# Patient Record
Sex: Female | Born: 1950 | Race: White | Hispanic: No | Smoking: Never smoker
Health system: Southern US, Community
[De-identification: ages and names within clinical notes are randomized; demographics above are authoritative.]

## PROBLEM LIST (undated history)

## (undated) DIAGNOSIS — F329 Major depressive disorder, single episode, unspecified: Secondary | ICD-10-CM

## (undated) DIAGNOSIS — F419 Anxiety disorder, unspecified: Secondary | ICD-10-CM

## (undated) DIAGNOSIS — F32A Depression, unspecified: Secondary | ICD-10-CM

## (undated) HISTORY — PX: CHOLECYSTECTOMY: SHX55

---

## 2014-03-30 ENCOUNTER — Ambulatory Visit (INDEPENDENT_AMBULATORY_CARE_PROVIDER_SITE_OTHER): Payer: BC Managed Care – PPO | Admitting: Emergency Medicine

## 2014-03-30 VITALS — BP 132/87 | HR 82 | Temp 98.4°F | Resp 18 | Wt 169.0 lb

## 2014-03-30 DIAGNOSIS — S61209A Unspecified open wound of unspecified finger without damage to nail, initial encounter: Secondary | ICD-10-CM

## 2014-03-30 DIAGNOSIS — S61011A Laceration without foreign body of right thumb without damage to nail, initial encounter: Secondary | ICD-10-CM

## 2014-03-30 NOTE — Progress Notes (Signed)
[facility] [address], [place] [id number] [phone]  Date:  03/30/2014   Name:  Margaret Perry   DOB:  08/18/1951   MRN:  [id number]  PCP:  Blair DolphinANNON, THOMAS B, MD    Chief Complaint: Laceration   History of Present Illness:  Margaret Perry is a 63 y.o. very pleasant female patient who presents with the following:  Patient was using a slicer at home and amputated the tip of her right thumb.  She is current on TD.  Not on anticoagulant.  No improvement with over the counter medications or other home remedies. Denies other complaint or health concern today.   There are no active problems to display for this patient.   No past medical history on file.  No past surgical history on file.  History  Substance Use Topics  . Smoking status: Not on file  . Smokeless tobacco: Not on file  . Alcohol Use: Not on file    No family history on file.  Allergies  Allergen Reactions  . Reglan [Metoclopramide]     Medication list has been reviewed and updated.  No current outpatient prescriptions on file prior to visit.   No current facility-administered medications on file prior to visit.    Review of Systems:  As per HPI, otherwise negative.    Physical Examination: Filed Vitals:   03/30/14 1703  BP: 132/87  Pulse: 82  Temp: 98.4 F (36.9 C)  Resp: 18   Filed Vitals:   03/30/14 1703  Weight: 169 lb (76.658 kg)   There is no height on file to calculate BMI. Ideal Body Weight:     GEN: WDWN, NAD, Non-toxic, Alert & Oriented x 3 HEENT: Atraumatic, Normocephalic.  Ears and Nose: No external deformity. EXTR: No clubbing/cyanosis/edema NEURO: Normal gait.  PSYCH: Normally interactive. Conversant. Not depressed or anxious appearing.  Calm demeanor.  RIght hand:  1 x 0.5 cm avulsion of tip of right thumb.  Spares the nail. NATI.  No FB  Assessment and Plan: Laceration finger "Band aid treatment" Follow up as needed  Signed,  Phillips OdorJeffery  Zidan Helget, MD

## 2014-03-30 NOTE — Patient Instructions (Signed)

## 2017-12-12 ENCOUNTER — Emergency Department (HOSPITAL_BASED_OUTPATIENT_CLINIC_OR_DEPARTMENT_OTHER)
Admission: EM | Admit: 2017-12-12 | Discharge: 2017-12-12 | Disposition: A | Discharge status: Home / Self Care | Payer: Medicare HMO | Attending: Emergency Medicine | Admitting: Emergency Medicine

## 2017-12-12 ENCOUNTER — Encounter (HOSPITAL_BASED_OUTPATIENT_CLINIC_OR_DEPARTMENT_OTHER): Payer: Self-pay | Admitting: *Deleted

## 2017-12-12 ENCOUNTER — Other Ambulatory Visit: Payer: Self-pay

## 2017-12-12 DIAGNOSIS — R42 Dizziness and giddiness: Secondary | ICD-10-CM | POA: Insufficient documentation

## 2017-12-12 DIAGNOSIS — E860 Dehydration: Secondary | ICD-10-CM | POA: Diagnosis not present

## 2017-12-12 DIAGNOSIS — R112 Nausea with vomiting, unspecified: Secondary | ICD-10-CM | POA: Diagnosis present

## 2017-12-12 DIAGNOSIS — Z79899 Other long term (current) drug therapy: Secondary | ICD-10-CM | POA: Insufficient documentation

## 2017-12-12 DIAGNOSIS — R197 Diarrhea, unspecified: Secondary | ICD-10-CM | POA: Insufficient documentation

## 2017-12-12 HISTORY — DX: Depression, unspecified: F32.A

## 2017-12-12 HISTORY — DX: Anxiety disorder, unspecified: F41.9

## 2017-12-12 HISTORY — DX: Major depressive disorder, single episode, unspecified: F32.9

## 2017-12-12 LAB — I-STAT CG4 LACTIC ACID, ED: Lactic Acid, Venous: 0.71 mmol/L (ref 0.5–1.9)

## 2017-12-12 LAB — URINALYSIS, ROUTINE W REFLEX MICROSCOPIC
BILIRUBIN URINE: NEGATIVE
GLUCOSE, UA: NEGATIVE mg/dL
KETONES UR: 15 mg/dL — AB
Leukocytes, UA: NEGATIVE
Nitrite: NEGATIVE
PH: 6 (ref 5.0–8.0)
Protein, ur: NEGATIVE mg/dL
Specific Gravity, Urine: 1.015 (ref 1.005–1.030)

## 2017-12-12 LAB — CBC WITH DIFFERENTIAL/PLATELET
BASOS ABS: 0 10*3/uL (ref 0.0–0.1)
Basophils Relative: 0 %
EOS PCT: 0 %
Eosinophils Absolute: 0 10*3/uL (ref 0.0–0.7)
HCT: 30.9 % — ABNORMAL LOW (ref 36.0–46.0)
Hemoglobin: 10.6 g/dL — ABNORMAL LOW (ref 12.0–15.0)
LYMPHS ABS: 1.3 10*3/uL (ref 0.7–4.0)
LYMPHS PCT: 10 %
MCH: 31.5 pg (ref 26.0–34.0)
MCHC: 34.3 g/dL (ref 30.0–36.0)
MCV: 92 fL (ref 78.0–100.0)
MONO ABS: 0.8 10*3/uL (ref 0.1–1.0)
MONOS PCT: 7 %
Neutro Abs: 10.2 10*3/uL — ABNORMAL HIGH (ref 1.7–7.7)
Neutrophils Relative %: 83 %
PLATELETS: 257 10*3/uL (ref 150–400)
RBC: 3.36 MIL/uL — ABNORMAL LOW (ref 3.87–5.11)
RDW: 13.4 % (ref 11.5–15.5)
WBC: 12.4 10*3/uL — ABNORMAL HIGH (ref 4.0–10.5)

## 2017-12-12 LAB — COMPREHENSIVE METABOLIC PANEL
ALT: 11 U/L — AB (ref 14–54)
AST: 15 U/L (ref 15–41)
Albumin: 3.5 g/dL (ref 3.5–5.0)
Alkaline Phosphatase: 64 U/L (ref 38–126)
Anion gap: 6 (ref 5–15)
BILIRUBIN TOTAL: 0.4 mg/dL (ref 0.3–1.2)
BUN: 43 mg/dL — AB (ref 6–20)
CHLORIDE: 108 mmol/L (ref 101–111)
CO2: 22 mmol/L (ref 22–32)
CREATININE: 0.97 mg/dL (ref 0.44–1.00)
Calcium: 8.4 mg/dL — ABNORMAL LOW (ref 8.9–10.3)
GFR calc Af Amer: >60 mL/min (ref >60)
GFR, EST NON AFRICAN AMERICAN: 59 mL/min — AB (ref >60)
Glucose, Bld: 128 mg/dL — ABNORMAL HIGH (ref 65–99)
Potassium: 4.5 mmol/L (ref 3.5–5.1)
Sodium: 136 mmol/L (ref 135–145)
Total Protein: 6.4 g/dL — ABNORMAL LOW (ref 6.5–8.1)

## 2017-12-12 LAB — LIPASE, BLOOD: LIPASE: 23 U/L (ref 11–51)

## 2017-12-12 LAB — URINALYSIS, MICROSCOPIC (REFLEX): WBC, UA: NONE SEEN WBC/hpf (ref 0–5)

## 2017-12-12 LAB — TROPONIN I: Troponin I: <0.03 ng/mL (ref <0.03)

## 2017-12-12 MED ORDER — PANTOPRAZOLE SODIUM 40 MG IV SOLR
40.0000 mg | Freq: Once | INTRAVENOUS | Status: AC
  Administered 2017-12-12: 40 mg via INTRAVENOUS
  Filled 2017-12-12: qty 40

## 2017-12-12 MED ORDER — SODIUM CHLORIDE 0.9 % IV BOLUS (SEPSIS)
1000.0000 mL | Freq: Once | INTRAVENOUS | Status: AC
  Administered 2017-12-12: 1000 mL via INTRAVENOUS

## 2017-12-12 MED ORDER — DIPHENOXYLATE-ATROPINE 2.5-0.025 MG PO TABS: 1.0000 | ORAL_TABLET | Freq: Four times a day (QID) | ORAL | 0 refills | Status: AC | PRN

## 2017-12-12 MED ORDER — ONDANSETRON 4 MG PO TBDP: 4.0000 mg | ORAL_TABLET | Freq: Three times a day (TID) | ORAL | 0 refills | Status: AC | PRN

## 2017-12-12 MED ORDER — DIPHENOXYLATE-ATROPINE 2.5-0.025 MG PO TABS
2.0000 | ORAL_TABLET | Freq: Once | ORAL | Status: AC
  Administered 2017-12-12: 2 via ORAL
  Filled 2017-12-12: qty 2

## 2017-12-12 MED ORDER — ONDANSETRON HCL 4 MG/2ML IJ SOLN
4.0000 mg | Freq: Once | INTRAMUSCULAR | Status: AC
  Administered 2017-12-12: 4 mg via INTRAVENOUS
  Filled 2017-12-12: qty 2

## 2017-12-12 NOTE — Discharge Instructions (Signed)
Push fluids to stay hydrated. You may still feel dizziness for the next 24 hours. Sit for a few seconds before you stand, stand for a few seconds before you walk. Follow-up with her primary care physician. Lomotil for diarrhea. Zofran as needed for nausea.

## 2017-12-12 NOTE — ED Triage Notes (Signed)
Abdominal pain yesterday. Vomiting last night and today. Weakness, fatigue. EMS transported her to the ED. They gave her 500cc NS and Zofran 4 mg. She is pale. Alert. No fever. States she thinks the last time she vomited she thinks it had blood in it.

## 2017-12-12 NOTE — ED Provider Notes (Addendum)
[facility] EMERGENCY DEPARTMENT Provider Note   CSN: [id number] Arrival date & time: 12/12/17  1431     History   Chief Complaint Chief Complaint  Patient presents with  . Emesis  . Abdominal Pain    HPI Margaret Perry is a 67 y.o. female. Chief complaint is dizziness vomiting and diarrhea  HPI 67 year old female. Started having nausea vomiting and diarrhea yesterday. Had emesis and symptoms all through the day. Diarrhea today. No blood pus or mucus in her stools. On her most recent episode of emesis she states her may been a few specks of blood but no frank hematemesis.  No abdominal pain. No chest pain.  No chest pain. No cardiac history. No recent fevers.  Past Medical History:  Diagnosis Date  . Anxiety   . Depression     There are no active problems to display for this patient.   Past Surgical History:  Procedure Laterality Date  . CHOLECYSTECTOMY      OB History    No data available       Home Medications    Prior to Admission medications   Medication Sig Start Date End Date Taking? Authorizing Provider  BuPROPion HCl (WELLBUTRIN PO) Take by mouth.   Yes [provider]  Escitalopram Oxalate (LEXAPRO PO) Take by mouth.   Yes [provider]  LORazepam (ATIVAN PO) Take by mouth.   Yes [provider]  diphenoxylate-atropine (LOMOTIL) 2.5-0.025 MG tablet Take 1 tablet by mouth 4 (four) times daily as needed for diarrhea or loose stools. 12/12/17   Rolland PorterJames, Layaan Mott, MD  ondansetron (ZOFRAN ODT) 4 MG disintegrating tablet Take 1 tablet (4 mg total) by mouth every 8 (eight) hours as needed for nausea. 12/12/17   Rolland PorterJames, Kailey Esquilin, MD    Family History No family history on file.  Social History Social History   Tobacco Use  . Smoking status: Never Smoker  . Smokeless tobacco: Never Used  Substance Use Topics  . Alcohol use: No    Frequency: Never  . Drug use: No     Allergies   Reglan [metoclopramide]   Review of  Systems Review of Systems  Constitutional: Negative for appetite change, chills, diaphoresis, fatigue and fever.  HENT: Negative for mouth sores, sore throat and trouble swallowing.   Eyes: Negative for visual disturbance.  Respiratory: Negative for cough, chest tightness, shortness of breath and wheezing.   Cardiovascular: Negative for chest pain.  Gastrointestinal: Positive for diarrhea, nausea and vomiting. Negative for abdominal distention and abdominal pain.  Endocrine: Negative for polydipsia, polyphagia and polyuria.  Genitourinary: Negative for dysuria, frequency and hematuria.  Musculoskeletal: Negative for gait problem.  Skin: Negative for color change, pallor and rash.  Neurological: Negative for dizziness, syncope, light-headedness and headaches.  Hematological: Does not bruise/bleed easily.  Psychiatric/Behavioral: Negative for behavioral problems and confusion.     Physical Exam Updated Vital Signs BP (!) 91/51   Pulse 72   Temp 98.1 F (36.7 C) (Oral)   Resp 12   Ht 5\' 4"  (1.626 m)   Wt 77.1 kg (170 lb)   SpO2 98%   BMI 29.18 kg/m   Physical Exam  Constitutional: She is oriented to person, place, and time. She appears well-developed and well-nourished. No distress.  Appears not feel well. Dry mucous membranes. Awake and alert.  HENT:  Head: Normocephalic.  Eyes: Conjunctivae are normal. Pupils are equal, round, and reactive to light. No scleral icterus.  Neck: Normal range of motion. Neck supple.  No thyromegaly present.  Cardiovascular: Normal rate and regular rhythm. Exam reveals no gallop and no friction rub.  No murmur heard. Pulmonary/Chest: Effort normal and breath sounds normal. No respiratory distress. She has no wheezes. She has no rales.  Abdominal: Soft. Bowel sounds are normal. She exhibits no distension. There is no tenderness. There is no rebound.  Abdomen soft. Nondistended. Nontender. Normal active bowel sounds. No pulsations. Her distal  perfusion is intact. 2+ DP and PT pulses. Capillary refill less than 2 seconds extremities.  Musculoskeletal: Normal range of motion.  Neurological: She is alert and oriented to person, place, and time.  Skin: Skin is warm and dry. No rash noted.  Psychiatric: She has a normal mood and affect. Her behavior is normal.     ED Treatments / Results  Labs (all labs ordered are listed, but only abnormal results are displayed) Labs Reviewed  COMPREHENSIVE METABOLIC PANEL - Abnormal; Notable for the following components:      Result Value   Glucose, Bld 128 (*)    BUN 43 (*)    Calcium 8.4 (*)    Total Protein 6.4 (*)    ALT 11 (*)    GFR calc non Af Amer 59 (*)    All other components within normal limits  CBC WITH DIFFERENTIAL/PLATELET - Abnormal; Notable for the following components:   WBC 12.4 (*)    RBC 3.36 (*)    Hemoglobin 10.6 (*)    HCT 30.9 (*)    Neutro Abs 10.2 (*)    All other components within normal limits  URINALYSIS, ROUTINE W REFLEX MICROSCOPIC - Abnormal; Notable for the following components:   Hgb urine dipstick SMALL (*)    Ketones, ur 15 (*)    All other components within normal limits  URINALYSIS, MICROSCOPIC (REFLEX) - Abnormal; Notable for the following components:   Bacteria, UA RARE (*)    Squamous Epithelial / LPF 0-5 (*)    All other components within normal limits  LIPASE, BLOOD  TROPONIN I  I-STAT CG4 LACTIC ACID, ED    EKG  EKG Interpretation  Date/Time:  Tuesday December 12 2017 15:54:34 EST Ventricular Rate:  80 PR Interval:    QRS Duration: 84 QT Interval:  411 QTC Calculation: 475 R Axis:   52 Text Interpretation:  Sinus rhythm Anteroseptal infarct, age indeterminate Confirmed by Rolland Porter ([id number]) on 12/12/2017 3:57:04 PM       Radiology No results found.  Procedures Procedures (including critical care time)  Medications Ordered in ED Medications  sodium chloride 0.9 % bolus 1,000 mL (0 mLs Intravenous Stopped 12/12/17 2032)    ondansetron (ZOFRAN) injection 4 mg (4 mg Intravenous Given 12/12/17 1605)  pantoprazole (PROTONIX) injection 40 mg (40 mg Intravenous Given 12/12/17 1606)  diphenoxylate-atropine (LOMOTIL) 2.5-0.025 MG per tablet 2 tablet (2 tablets Oral Given 12/12/17 1605)  sodium chloride 0.9 % bolus 1,000 mL (0 mLs Intravenous Stopped 12/12/17 1938)     Initial Impression / Assessment and Plan / ED Course  I have reviewed the triage vital signs and the nursing notes.  Pertinent labs & imaging results that were available during my care of the patient were reviewed by me and considered in my medical decision making (see chart for details).    Hypotensive. Given IV fluids. States her blood pressure does run low at times. Given Zofran by mouth for given 3 L of fluid. Subjectively feels considerably better. Is able to ambulate to the bathroom. Normal lactate. Normal EKG and  troponin. Recheck BP consistently in the 100-110 range.  Hemoglobin is 10. She does not know what her normal hemoglobin is. There are no labs in our system.  Discuss this with her at length. She is not describing frank hematemesis. She does not have abdominal pain. She does not have dark stools. If she develops any additional symptoms. If she has any worsening orthostasis or dizziness at home. If she has blood in her stools or dark stools or has emesis with blood in bastard recheck here immediately. She stress understanding of this. Discharge in stable condition.   Discharge home. Push fluids. Orthostasis precautions. Zofran, Lomotil when necessary. Primary care follow-up. ER with worsening  Final Clinical Impressions(s) / ED Diagnoses   Final diagnoses:  Nausea vomiting and diarrhea  Dehydration    ED Discharge Orders        Ordered    ondansetron (ZOFRAN ODT) 4 MG disintegrating tablet  Every 8 hours PRN     12/12/17 2028    diphenoxylate-atropine (LOMOTIL) 2.5-0.025 MG tablet  4 times daily PRN     12/12/17 2028       Rolland Porter, MD 12/12/17 2108    Rolland Porter, MD 12/12/17 2110

## 2017-12-16 ENCOUNTER — Encounter (HOSPITAL_COMMUNITY): Payer: Self-pay | Admitting: *Deleted

## 2017-12-16 ENCOUNTER — Inpatient Hospital Stay (HOSPITAL_COMMUNITY)
Admission: EM | Admit: 2017-12-16 | Discharge: 2017-12-19 | DRG: 378 | Disposition: A | Discharge status: Home / Self Care | Payer: Medicare HMO | Attending: Internal Medicine | Admitting: Internal Medicine

## 2017-12-16 ENCOUNTER — Emergency Department (HOSPITAL_COMMUNITY): Payer: Medicare HMO

## 2017-12-16 ENCOUNTER — Other Ambulatory Visit: Payer: Self-pay

## 2017-12-16 DIAGNOSIS — F419 Anxiety disorder, unspecified: Secondary | ICD-10-CM | POA: Diagnosis present

## 2017-12-16 DIAGNOSIS — K5730 Diverticulosis of large intestine without perforation or abscess without bleeding: Secondary | ICD-10-CM | POA: Diagnosis present

## 2017-12-16 DIAGNOSIS — F329 Major depressive disorder, single episode, unspecified: Secondary | ICD-10-CM | POA: Diagnosis present

## 2017-12-16 DIAGNOSIS — K921 Melena: Principal | ICD-10-CM | POA: Diagnosis present

## 2017-12-16 DIAGNOSIS — K269 Duodenal ulcer, unspecified as acute or chronic, without hemorrhage or perforation: Secondary | ICD-10-CM | POA: Diagnosis present

## 2017-12-16 DIAGNOSIS — D62 Acute posthemorrhagic anemia: Secondary | ICD-10-CM | POA: Diagnosis present

## 2017-12-16 DIAGNOSIS — F32A Depression, unspecified: Secondary | ICD-10-CM | POA: Diagnosis present

## 2017-12-16 DIAGNOSIS — K2960 Other gastritis without bleeding: Secondary | ICD-10-CM | POA: Diagnosis present

## 2017-12-16 DIAGNOSIS — E876 Hypokalemia: Secondary | ICD-10-CM | POA: Diagnosis present

## 2017-12-16 DIAGNOSIS — G4700 Insomnia, unspecified: Secondary | ICD-10-CM | POA: Diagnosis present

## 2017-12-16 DIAGNOSIS — M5136 Other intervertebral disc degeneration, lumbar region: Secondary | ICD-10-CM | POA: Diagnosis present

## 2017-12-16 DIAGNOSIS — Z888 Allergy status to other drugs, medicaments and biological substances status: Secondary | ICD-10-CM

## 2017-12-16 DIAGNOSIS — D649 Anemia, unspecified: Secondary | ICD-10-CM | POA: Diagnosis present

## 2017-12-16 DIAGNOSIS — G43909 Migraine, unspecified, not intractable, without status migrainosus: Secondary | ICD-10-CM | POA: Diagnosis present

## 2017-12-16 DIAGNOSIS — R402414 Glasgow coma scale score 13-15, 24 hours or more after hospital admission: Secondary | ICD-10-CM | POA: Diagnosis not present

## 2017-12-16 DIAGNOSIS — K2980 Duodenitis without bleeding: Secondary | ICD-10-CM | POA: Diagnosis present

## 2017-12-16 DIAGNOSIS — K922 Gastrointestinal hemorrhage, unspecified: Secondary | ICD-10-CM | POA: Diagnosis present

## 2017-12-16 DIAGNOSIS — K429 Umbilical hernia without obstruction or gangrene: Secondary | ICD-10-CM | POA: Diagnosis present

## 2017-12-16 DIAGNOSIS — Z79899 Other long term (current) drug therapy: Secondary | ICD-10-CM

## 2017-12-16 LAB — CBC
HEMATOCRIT: 22 % — AB (ref 36.0–46.0)
Hemoglobin: 7.4 g/dL — ABNORMAL LOW (ref 12.0–15.0)
MCH: 31 pg (ref 26.0–34.0)
MCHC: 33.6 g/dL (ref 30.0–36.0)
MCV: 92.1 fL (ref 78.0–100.0)
Platelets: 290 10*3/uL (ref 150–400)
RBC: 2.39 MIL/uL — AB (ref 3.87–5.11)
RDW: 14.5 % (ref 11.5–15.5)
WBC: 7.4 10*3/uL (ref 4.0–10.5)

## 2017-12-16 LAB — COMPREHENSIVE METABOLIC PANEL
ALT: 16 U/L (ref 14–54)
ANION GAP: 11 (ref 5–15)
AST: 23 U/L (ref 15–41)
Albumin: 3.4 g/dL — ABNORMAL LOW (ref 3.5–5.0)
Alkaline Phosphatase: 60 U/L (ref 38–126)
BUN: 9 mg/dL (ref 6–20)
CHLORIDE: 107 mmol/L (ref 101–111)
CO2: 21 mmol/L — AB (ref 22–32)
Calcium: 8.6 mg/dL — ABNORMAL LOW (ref 8.9–10.3)
Creatinine, Ser: 0.92 mg/dL (ref 0.44–1.00)
GFR calc non Af Amer: >60 mL/min (ref >60)
Glucose, Bld: 140 mg/dL — ABNORMAL HIGH (ref 65–99)
POTASSIUM: 3.3 mmol/L — AB (ref 3.5–5.1)
SODIUM: 139 mmol/L (ref 135–145)
Total Bilirubin: 0.4 mg/dL (ref 0.3–1.2)
Total Protein: 5.9 g/dL — ABNORMAL LOW (ref 6.5–8.1)

## 2017-12-16 LAB — URINALYSIS, ROUTINE W REFLEX MICROSCOPIC
BILIRUBIN URINE: NEGATIVE
Bacteria, UA: NONE SEEN
Glucose, UA: NEGATIVE mg/dL
Hgb urine dipstick: NEGATIVE
KETONES UR: 5 mg/dL — AB
Nitrite: NEGATIVE
PROTEIN: NEGATIVE mg/dL
RBC / HPF: NONE SEEN RBC/hpf (ref 0–5)
Specific Gravity, Urine: 1.012 (ref 1.005–1.030)
pH: 5 (ref 5.0–8.0)

## 2017-12-16 LAB — DIFFERENTIAL
BASOS ABS: 0 10*3/uL (ref 0.0–0.1)
Basophils Relative: 0 %
Eosinophils Absolute: 0.1 10*3/uL (ref 0.0–0.7)
Eosinophils Relative: 1 %
Lymphocytes Relative: 24 %
Lymphs Abs: 1.7 10*3/uL (ref 0.7–4.0)
MONO ABS: 0.5 10*3/uL (ref 0.1–1.0)
Monocytes Relative: 8 %
NEUTROS ABS: 4.8 10*3/uL (ref 1.7–7.7)
Neutrophils Relative %: 67 %

## 2017-12-16 LAB — POC OCCULT BLOOD, ED: FECAL OCCULT BLD: POSITIVE — AB

## 2017-12-16 LAB — PROTIME-INR
INR: 1.07
Prothrombin Time: 13.8 seconds (ref 11.4–15.2)

## 2017-12-16 LAB — LIPASE, BLOOD: LIPASE: 26 U/L (ref 11–51)

## 2017-12-16 MED ORDER — PANTOPRAZOLE SODIUM 40 MG IV SOLR
40.0000 mg | INTRAVENOUS | Status: DC
  Filled 2017-12-16: qty 40

## 2017-12-16 MED ORDER — IOPAMIDOL (ISOVUE-300) INJECTION 61%
INTRAVENOUS | Status: AC
  Administered 2017-12-16: 100 mL
  Filled 2017-12-16: qty 100

## 2017-12-16 MED ORDER — SODIUM CHLORIDE 0.9 % IV SOLN
80.0000 mg | Freq: Once | INTRAVENOUS | Status: AC
  Administered 2017-12-17: 80 mg via INTRAVENOUS
  Filled 2017-12-16: qty 80

## 2017-12-16 MED ORDER — SODIUM CHLORIDE 0.9 % IV BOLUS (SEPSIS)
1000.0000 mL | Freq: Once | INTRAVENOUS | Status: AC
  Administered 2017-12-16: 1000 mL via INTRAVENOUS

## 2017-12-16 MED ORDER — PANTOPRAZOLE SODIUM 40 MG IV SOLR
8.0000 mg/h | INTRAVENOUS | Status: DC
  Administered 2017-12-17 – 2017-12-18 (×2): 8 mg/h via INTRAVENOUS
  Filled 2017-12-16 (×5): qty 80

## 2017-12-16 NOTE — ED Notes (Signed)
ED Provider at bedside. 

## 2017-12-16 NOTE — ED Notes (Signed)
Pt reports 12-11-17 had vomited x 1, diarrhea x 2. 12-12-17 pt felt too weak to stand, was seen in ED.   Hgb 10.  Has chronic RUQ abd "burning" that is worsening.  Seen GI doctor yesterday declined endoscopy.  Last BM 12-11-17 that was black and tarry.  Shortness of breath with moving.

## 2017-12-16 NOTE — ED Triage Notes (Signed)
Pt has been having NVD for the past week. [facility] for hypotensive and similar symptoms then discharge home. Pt pale , appears generally weak, has not vomited today. C/o RUQ pain as well

## 2017-12-16 NOTE — ED Provider Notes (Signed)
[facility] EMERGENCY DEPARTMENT Provider Note   CSN: [id number] Arrival date & time: 12/16/17  1941     History   Chief Complaint Chief Complaint  Patient presents with  . Abdominal Pain    HPI Margaret Perry is a 67 y.o. female.  67 year old female with a history of anxiety and depression presents to the emergency department for generalized weakness.  She states, "I think I am losing blood from somewhere".  She states that symptoms began 5 days ago, on Monday.  She had one episode of emesis as well as one episode of diarrhea.  This was followed by profound weakness the following day.  She was seen at [facility] where she was hypotensive with a blood pressure of 89/46.  This improved with IV fluids, making the patient feels slightly better.  She was discharged and reports continued fatigue.  She has worsening shortness of breath with any type of exertion.  She has not had any nausea or vomiting.  She denies any bowel movement in the past 5 days since her episode of diarrhea.  This bowel movement was not specifically tarry and was not c/w hematochezia.  She did see her gastroenterologist yesterday who advised endoscopy.  She did not have this completed because she did not have someone to drive her home following sedation.  She denies frequent NSAID use.  She does report some associated right upper quadrant pain.  This is nonradiating and "burning" in nature.  Abdominal surgical history significant for cholecystectomy.  Patient denies fevers, urinary symptoms, chest pain.  She did have a mild headache yesterday which has resolved.  She is unsure of the timing of her last colonoscopy.  GI - [facility]      Past Medical History:  Diagnosis Date  . Anxiety   . Depression     There are no active problems to display for this patient.   Past Surgical History:  Procedure Laterality Date  . CHOLECYSTECTOMY      OB History      No data available       Home Medications    Prior to Admission medications   Medication Sig Start Date End Date Taking? Authorizing Provider  ALPRAZolam Prudy Feeler) 0.5 MG tablet Take 0.5 mg by mouth 3 (three) times daily as needed for anxiety.   Yes [provider]  aspirin-acetaminophen-caffeine (EXCEDRIN MIGRAINE) [phone] MG tablet Take 1-2 tablets by mouth every 6 (six) hours as needed for headache.   Yes [provider]  buPROPion (WELLBUTRIN XL) 300 MG 24 hr tablet Take 300 mg by mouth daily.    Yes [provider]  diphenoxylate-atropine (LOMOTIL) 2.5-0.025 MG tablet Take 1 tablet by mouth 4 (four) times daily as needed for diarrhea or loose stools. 12/12/17  Yes Rolland Porter, MD  escitalopram (LEXAPRO) 20 MG tablet Take 20 mg by mouth daily.   Yes [provider]  ondansetron (ZOFRAN ODT) 4 MG disintegrating tablet Take 1 tablet (4 mg total) by mouth every 8 (eight) hours as needed for nausea. 12/12/17  Yes Rolland Porter, MD  zolpidem (AMBIEN) 5 MG tablet Take 20 mg by mouth at bedtime.    Yes [provider]    Family History No family history on file.  Social History Social History   Tobacco Use  . Smoking status: Never Smoker  . Smokeless tobacco: Never Used  Substance Use Topics  . Alcohol use: No    Frequency: Never  .  Drug use: No     Allergies   Reglan [metoclopramide]   Review of Systems Review of Systems Ten systems reviewed and are negative for acute change, except as noted in the HPI.    Physical Exam Updated Vital Signs BP (!) 126/56   Pulse 94   Temp 98.7 F (37.1 C) (Oral)   Resp 16   SpO2 100%   Physical Exam  Constitutional: She is oriented to person, place, and time. She appears well-developed and well-nourished. No distress.  Nontoxic appearing and in NAD  HENT:  Head: Normocephalic and atraumatic.  Eyes: Conjunctivae and EOM are normal. No scleral icterus.  Neck: Normal range of motion.   Cardiovascular: Normal rate, regular rhythm and intact distal pulses.  Pulmonary/Chest: Effort normal. No stridor. No respiratory distress.  Respirations even and unlabored  Abdominal: Soft. Normal appearance. There is tenderness in the right upper quadrant.  Soft abdomen with RUQ TTP. No masses or peritoneal signs.  Genitourinary:  Genitourinary Comments: Scant amount of tarry stool on DRE. No significant stool in rectal vault.  Musculoskeletal: Normal range of motion.  Neurological: She is alert and oriented to person, place, and time. She exhibits normal muscle tone. Coordination normal.  Skin: Skin is warm and dry. No rash noted. She is not diaphoretic. No erythema. No pallor.  Psychiatric: She has a normal mood and affect. Her behavior is normal.  Nursing note and vitals reviewed.    ED Treatments / Results  Labs (all labs ordered are listed, but only abnormal results are displayed) Labs Reviewed  COMPREHENSIVE METABOLIC PANEL - Abnormal; Notable for the following components:      Result Value   Potassium 3.3 (*)    CO2 21 (*)    Glucose, Bld 140 (*)    Calcium 8.6 (*)    Total Protein 5.9 (*)    Albumin 3.4 (*)    All other components within normal limits  CBC - Abnormal; Notable for the following components:   RBC 2.39 (*)    Hemoglobin 7.4 (*)    HCT 22.0 (*)    All other components within normal limits  URINALYSIS, ROUTINE W REFLEX MICROSCOPIC - Abnormal; Notable for the following components:   Ketones, ur 5 (*)    Leukocytes, UA TRACE (*)    Squamous Epithelial / LPF 0-5 (*)    All other components within normal limits  POC OCCULT BLOOD, ED - Abnormal; Notable for the following components:   Fecal Occult Bld POSITIVE (*)    All other components within normal limits  LIPASE, BLOOD  PROTIME-INR  DIFFERENTIAL  TYPE AND SCREEN  ABO/RH    EKG  EKG Interpretation None       Radiology Ct Abdomen Pelvis W Contrast  Result Date: 12/16/2017 CLINICAL DATA:   67 year old female with abdominal pain. EXAM: CT ABDOMEN AND PELVIS WITH CONTRAST TECHNIQUE: Multidetector CT imaging of the abdomen and pelvis was performed using the standard protocol following bolus administration of intravenous contrast. CONTRAST:  ISOVUE-300 IOPAMIDOL (ISOVUE-300) INJECTION 61% COMPARISON:  Lumbar spine radiograph dated 07/18/2017 FINDINGS: Lower chest: Minimal right lung base atelectasis/scarring. No intra-free air or free fluid. Hepatobiliary: Cholecystectomy. The liver is unremarkable. No intrahepatic biliary ductal dilatation. Pancreas: Unremarkable. No pancreatic ductal dilatation or surrounding inflammatory changes. Spleen: Normal in size without focal abnormality. Adrenals/Urinary Tract: Adrenal glands are unremarkable. Kidneys are normal, without renal calculi, focal lesion, or hydronephrosis. Bladder is unremarkable. Stomach/Bowel: There is inflammatory changes of the second portion of the duodenum  most consistent with duodenitis. Underlying peptic ulcer is not entirely excluded. Clinical correlation is recommended there are small scattered distal colonic diverticula without active inflammatory changes. No bowel obstruction. Normal appendix. Vascular/Lymphatic: Mild aortoiliac atherosclerotic disease. The origins of the celiac axis, SMA, IMA are patent. No portal venous gas. There is no adenopathy. Reproductive: The uterus and ovaries are grossly unremarkable. Other: Small fat containing umbilical hernia Musculoskeletal: Degenerative changes at L4-L5. No acute osseous pathology. IMPRESSION: 1. Inflammatory changes of the second portion of the duodenum may represent duodenitis. Peptic ulcer is not excluded. There is no fluid collection or perforation. 2. Sigmoid diverticulosis.  No bowel obstruction.  Normal appendix. 3. Mild Aortic Atherosclerosis (ICD10-I70.0). Electronically Signed   By: Elgie CollardArash  Radparvar M.D.   On: 12/16/2017 23:16    Procedures Procedures (including  critical care time)  Medications Ordered in ED Medications  pantoprazole (PROTONIX) 80 mg in sodium chloride 0.9 % 100 mL IVPB (not administered)  pantoprazole (PROTONIX) 80 mg in sodium chloride 0.9 % 250 mL (0.32 mg/mL) infusion (not administered)  sodium chloride 0.9 % bolus 1,000 mL (0 mLs Intravenous Stopped 12/16/17 2302)  iopamidol (ISOVUE-300) 61 % injection (100 mLs  Contrast Given 12/16/17 2248)    CRITICAL CARE Performed by: Antony MaduraKelly Kshawn Canal   Total critical care time: 35 minutes  Critical care time was exclusive of separately billable procedures and treating other patients.  Critical care was necessary to treat or prevent imminent or life-threatening deterioration.  Critical care was time spent personally by me on the following activities: development of treatment plan with patient and/or surrogate as well as nursing, discussions with consultants, evaluation of patient's response to treatment, examination of patient, obtaining history from patient or surrogate, ordering and performing treatments and interventions, ordering and review of laboratory studies, ordering and review of radiographic studies, pulse oximetry and re-evaluation of patient's condition.   Initial Impression / Assessment and Plan / ED Course  I have reviewed the triage vital signs and the nursing notes.  Pertinent labs & imaging results that were available during my care of the patient were reviewed by me and considered in my medical decision making (see chart for details).      67 year old female presents to the emergency department for generalized weakness and dyspnea on exertion.  She was found to have a 3 g hemoglobin drop from her prior visit 4 days ago.  A CT was obtained given history of abdominal pain to rule out any obstruction or mass; patient had reported no bowel movement in the past 5 days.  CT shows findings consistent with duodenitis which is likely the source of her bleeding.  IV Protonix  ordered and plan for admission for EGD.  She has been hydrated with IV fluids.  No transfusion initiated in the ED given hemodynamic stability since arrival.  Case discussed with Dr. Antionette Charpyd who will admit.  Patient [facility] by [facility], Dr. Katrinka BlazingSmith.   Final Clinical Impressions(s) / ED Diagnoses   Final diagnoses:  UGI bleed  Symptomatic anemia    ED Discharge Orders    None       Antony MaduraHumes, Camilah Spillman, PA-C 12/16/17 2358    Donnetta Hutchingook, Brian, MD 12/20/17 1007

## 2017-12-17 ENCOUNTER — Encounter (HOSPITAL_COMMUNITY)
Admission: EM | Disposition: A | Discharge status: Home / Self Care | Payer: Self-pay | Source: Home / Self Care | Attending: Internal Medicine

## 2017-12-17 ENCOUNTER — Encounter (HOSPITAL_COMMUNITY): Payer: Self-pay | Admitting: *Deleted

## 2017-12-17 ENCOUNTER — Other Ambulatory Visit: Payer: Self-pay

## 2017-12-17 DIAGNOSIS — G43909 Migraine, unspecified, not intractable, without status migrainosus: Secondary | ICD-10-CM | POA: Diagnosis present

## 2017-12-17 DIAGNOSIS — K5730 Diverticulosis of large intestine without perforation or abscess without bleeding: Secondary | ICD-10-CM | POA: Diagnosis present

## 2017-12-17 DIAGNOSIS — K269 Duodenal ulcer, unspecified as acute or chronic, without hemorrhage or perforation: Secondary | ICD-10-CM | POA: Diagnosis present

## 2017-12-17 DIAGNOSIS — K922 Gastrointestinal hemorrhage, unspecified: Secondary | ICD-10-CM | POA: Diagnosis present

## 2017-12-17 DIAGNOSIS — G4700 Insomnia, unspecified: Secondary | ICD-10-CM | POA: Diagnosis present

## 2017-12-17 DIAGNOSIS — F419 Anxiety disorder, unspecified: Secondary | ICD-10-CM | POA: Diagnosis present

## 2017-12-17 DIAGNOSIS — K2980 Duodenitis without bleeding: Secondary | ICD-10-CM | POA: Diagnosis present

## 2017-12-17 DIAGNOSIS — K429 Umbilical hernia without obstruction or gangrene: Secondary | ICD-10-CM | POA: Diagnosis present

## 2017-12-17 DIAGNOSIS — E876 Hypokalemia: Secondary | ICD-10-CM | POA: Diagnosis present

## 2017-12-17 DIAGNOSIS — D62 Acute posthemorrhagic anemia: Secondary | ICD-10-CM | POA: Diagnosis present

## 2017-12-17 DIAGNOSIS — D649 Anemia, unspecified: Secondary | ICD-10-CM

## 2017-12-17 DIAGNOSIS — M5136 Other intervertebral disc degeneration, lumbar region: Secondary | ICD-10-CM | POA: Diagnosis present

## 2017-12-17 DIAGNOSIS — Z888 Allergy status to other drugs, medicaments and biological substances status: Secondary | ICD-10-CM | POA: Diagnosis not present

## 2017-12-17 DIAGNOSIS — F329 Major depressive disorder, single episode, unspecified: Secondary | ICD-10-CM | POA: Diagnosis present

## 2017-12-17 DIAGNOSIS — K2960 Other gastritis without bleeding: Secondary | ICD-10-CM | POA: Diagnosis present

## 2017-12-17 DIAGNOSIS — Z79899 Other long term (current) drug therapy: Secondary | ICD-10-CM | POA: Diagnosis not present

## 2017-12-17 DIAGNOSIS — R402414 Glasgow coma scale score 13-15, 24 hours or more after hospital admission: Secondary | ICD-10-CM | POA: Diagnosis not present

## 2017-12-17 DIAGNOSIS — K921 Melena: Secondary | ICD-10-CM | POA: Diagnosis present

## 2017-12-17 HISTORY — PX: ESOPHAGOGASTRODUODENOSCOPY: SHX5428

## 2017-12-17 LAB — CBC
HCT: 22 % — ABNORMAL LOW (ref 36.0–46.0)
HEMOGLOBIN: 7.4 g/dL — AB (ref 12.0–15.0)
MCH: 31.5 pg (ref 26.0–34.0)
MCHC: 33.6 g/dL (ref 30.0–36.0)
MCV: 93.6 fL (ref 78.0–100.0)
PLATELETS: 200 10*3/uL (ref 150–400)
RBC: 2.35 MIL/uL — AB (ref 3.87–5.11)
RDW: 15 % (ref 11.5–15.5)
WBC: 6.6 10*3/uL (ref 4.0–10.5)

## 2017-12-17 LAB — HEMOGLOBIN AND HEMATOCRIT, BLOOD
HCT: 30.2 % — ABNORMAL LOW (ref 36.0–46.0)
HEMOGLOBIN: 9.9 g/dL — AB (ref 12.0–15.0)

## 2017-12-17 LAB — BASIC METABOLIC PANEL
ANION GAP: 9 (ref 5–15)
BUN: 8 mg/dL (ref 6–20)
CHLORIDE: 111 mmol/L (ref 101–111)
CO2: 21 mmol/L — AB (ref 22–32)
Calcium: 7.8 mg/dL — ABNORMAL LOW (ref 8.9–10.3)
Creatinine, Ser: 0.8 mg/dL (ref 0.44–1.00)
GFR calc Af Amer: >60 mL/min (ref >60)
GFR calc non Af Amer: >60 mL/min (ref >60)
GLUCOSE: 108 mg/dL — AB (ref 65–99)
POTASSIUM: 3.4 mmol/L — AB (ref 3.5–5.1)
Sodium: 141 mmol/L (ref 135–145)

## 2017-12-17 LAB — PREPARE RBC (CROSSMATCH)

## 2017-12-17 LAB — ABO/RH: ABO/RH(D): O POS

## 2017-12-17 SURGERY — EGD (ESOPHAGOGASTRODUODENOSCOPY)
Anesthesia: Moderate Sedation | Laterality: Left

## 2017-12-17 MED ORDER — SODIUM CHLORIDE 0.9% FLUSH
3.0000 mL | Freq: Two times a day (BID) | INTRAVENOUS | Status: DC
  Administered 2017-12-17 – 2017-12-19 (×4): 3 mL via INTRAVENOUS

## 2017-12-17 MED ORDER — FENTANYL CITRATE (PF) 100 MCG/2ML IJ SOLN
INTRAMUSCULAR | Status: AC
  Filled 2017-12-17: qty 4

## 2017-12-17 MED ORDER — DIPHENHYDRAMINE HCL 50 MG/ML IJ SOLN
INTRAMUSCULAR | Status: AC
  Filled 2017-12-17: qty 1

## 2017-12-17 MED ORDER — ACETAMINOPHEN 325 MG PO TABS
650.0000 mg | ORAL_TABLET | Freq: Four times a day (QID) | ORAL | Status: DC | PRN
  Administered 2017-12-18 – 2017-12-19 (×2): 650 mg via ORAL
  Filled 2017-12-17 (×2): qty 2

## 2017-12-17 MED ORDER — POTASSIUM CHLORIDE IN NACL 20-0.9 MEQ/L-% IV SOLN
INTRAVENOUS | Status: AC
  Administered 2017-12-17: 01:00:00 via INTRAVENOUS
  Filled 2017-12-17 (×2): qty 1000

## 2017-12-17 MED ORDER — MIDAZOLAM HCL 10 MG/2ML IJ SOLN
INTRAMUSCULAR | Status: DC | PRN
  Administered 2017-12-17 (×2): 2 mg via INTRAVENOUS
  Administered 2017-12-17: 1 mg via INTRAVENOUS

## 2017-12-17 MED ORDER — SPOT INK MARKER SYRINGE KIT
PACK | SUBMUCOSAL | Status: AC
  Filled 2017-12-17: qty 5

## 2017-12-17 MED ORDER — SODIUM CHLORIDE 0.9 % IV SOLN: 250.0000 mL | INTRAVENOUS | Status: DC | PRN

## 2017-12-17 MED ORDER — ONDANSETRON HCL 4 MG/2ML IJ SOLN: 4.0000 mg | Freq: Four times a day (QID) | INTRAMUSCULAR | Status: DC | PRN

## 2017-12-17 MED ORDER — ALPRAZOLAM 0.5 MG PO TABS
0.5000 mg | ORAL_TABLET | Freq: Three times a day (TID) | ORAL | Status: DC | PRN
  Administered 2017-12-17 – 2017-12-18 (×3): 0.5 mg via ORAL
  Filled 2017-12-17 (×3): qty 1

## 2017-12-17 MED ORDER — DIPHENHYDRAMINE HCL 50 MG/ML IJ SOLN
INTRAMUSCULAR | Status: DC | PRN
  Administered 2017-12-17 (×2): 25 mg via INTRAVENOUS

## 2017-12-17 MED ORDER — ACETAMINOPHEN 650 MG RE SUPP: 650.0000 mg | Freq: Four times a day (QID) | RECTAL | Status: DC | PRN

## 2017-12-17 MED ORDER — FENTANYL CITRATE (PF) 100 MCG/2ML IJ SOLN
INTRAMUSCULAR | Status: DC | PRN
  Administered 2017-12-17 (×3): 25 ug via INTRAVENOUS

## 2017-12-17 MED ORDER — EPINEPHRINE PF 1 MG/10ML IJ SOSY
PREFILLED_SYRINGE | INTRAMUSCULAR | Status: AC
  Filled 2017-12-17: qty 10

## 2017-12-17 MED ORDER — ESCITALOPRAM OXALATE 20 MG PO TABS
20.0000 mg | ORAL_TABLET | Freq: Every day | ORAL | Status: DC
  Administered 2017-12-18 – 2017-12-19 (×2): 20 mg via ORAL
  Filled 2017-12-17 (×2): qty 1

## 2017-12-17 MED ORDER — ONDANSETRON HCL 4 MG PO TABS: 4.0000 mg | ORAL_TABLET | Freq: Four times a day (QID) | ORAL | Status: DC | PRN

## 2017-12-17 MED ORDER — SODIUM CHLORIDE 0.9 % IV SOLN
Freq: Once | INTRAVENOUS | Status: AC
  Administered 2017-12-17: 1000 mL via INTRAVENOUS

## 2017-12-17 MED ORDER — ZOLPIDEM TARTRATE 5 MG PO TABS
5.0000 mg | ORAL_TABLET | Freq: Every day | ORAL | Status: DC
  Administered 2017-12-17: 5 mg via ORAL
  Filled 2017-12-17: qty 1

## 2017-12-17 MED ORDER — POTASSIUM CHLORIDE 10 MEQ/100ML IV SOLN
10.0000 meq | INTRAVENOUS | Status: AC
  Administered 2017-12-17 (×2): 10 meq via INTRAVENOUS
  Filled 2017-12-17: qty 100

## 2017-12-17 MED ORDER — LORAZEPAM 2 MG/ML IJ SOLN
0.5000 mg | Freq: Once | INTRAMUSCULAR | Status: AC
  Administered 2017-12-17: 0.5 mg via INTRAVENOUS
  Filled 2017-12-17: qty 1

## 2017-12-17 MED ORDER — BUPROPION HCL ER (XL) 150 MG PO TB24
300.0000 mg | ORAL_TABLET | Freq: Every day | ORAL | Status: DC
  Administered 2017-12-18 – 2017-12-19 (×2): 300 mg via ORAL
  Filled 2017-12-17 (×2): qty 2

## 2017-12-17 MED ORDER — SODIUM CHLORIDE 0.9 % IV SOLN: INTRAVENOUS | Status: DC

## 2017-12-17 MED ORDER — DIPHENHYDRAMINE HCL 25 MG PO CAPS
25.0000 mg | ORAL_CAPSULE | Freq: Once | ORAL | Status: AC
  Administered 2017-12-17: 25 mg via ORAL
  Filled 2017-12-17: qty 1

## 2017-12-17 MED ORDER — SODIUM CHLORIDE 0.9% FLUSH: 3.0000 mL | INTRAVENOUS | Status: DC | PRN

## 2017-12-17 MED ORDER — FENTANYL CITRATE (PF) 100 MCG/2ML IJ SOLN: 25.0000 ug | INTRAMUSCULAR | Status: DC | PRN

## 2017-12-17 MED ORDER — SODIUM CHLORIDE 0.9 % IV SOLN
Freq: Once | INTRAVENOUS | Status: AC
  Administered 2017-12-17: 01:00:00 via INTRAVENOUS

## 2017-12-17 MED ORDER — ZOLPIDEM TARTRATE 5 MG PO TABS
5.0000 mg | ORAL_TABLET | Freq: Every evening | ORAL | Status: DC | PRN
  Administered 2017-12-17 – 2017-12-18 (×2): 5 mg via ORAL
  Filled 2017-12-17 (×2): qty 1

## 2017-12-17 MED ORDER — POTASSIUM CHLORIDE 10 MEQ/100ML IV SOLN
10.0000 meq | INTRAVENOUS | Status: AC
  Administered 2017-12-17: 10 meq via INTRAVENOUS
  Filled 2017-12-17: qty 100

## 2017-12-17 MED ORDER — MIDAZOLAM HCL 5 MG/ML IJ SOLN
INTRAMUSCULAR | Status: AC
  Filled 2017-12-17: qty 3

## 2017-12-17 NOTE — Progress Notes (Signed)
67 YO Female admitted with black tarry stools upper GI bleed.  Started on protonix drip and got one of blood transfusion.  Patient was admitted with a hemoglobin of 7.4 which was down from 10.64 days ago.  Posttransfusion hemoglobin is still 7.4 patient feels weak and lethargic and short of breath.  I placed a call to Dr. Loreta AveMann who has kindly agreed to see the patient today.  Continue n.p.o.

## 2017-12-17 NOTE — ED Notes (Signed)
C/o feeling light headed. C/o pain in elbow

## 2017-12-17 NOTE — ED Notes (Signed)
Patient denies pain and is resting comfortably.  

## 2017-12-17 NOTE — Progress Notes (Signed)
Reviewed MAR with pt and requested Ambien 10mg , Benadryl or Melatonin to sleep per c/o getting Ambien 5mg  last HS and not sleeping.

## 2017-12-17 NOTE — H&P (Signed)
History and Physical    Margaret Perry WJX:[id number] DOB: 1951/07/19 DOA: 12/16/2017  PCP: Herma Carson, MD   Patient coming from: Home  Chief Complaint: DOE, lethargy, pallor, RUQ pain   HPI: Margaret Perry is a 67 y.o. female with medical history significant for migraines, anxiety, depression, and insomnia, now presenting to the emergency department with right upper quadrant abdominal pain, exertional dyspnea, lethargy, and pallor.  Patient reports that she had been in her usual state of health until approximately 5 days ago when she noted a burning discomfort in the right upper quadrant with mild nausea and one episode of vomiting which may have contained some specks of red blood.  She then had a bowel movement described as black and tarry.  Since that time, she has noted progressive exertional dyspnea and lethargy.  She was seen in the emergency department 1 day after the black tarry stool, was fluid resuscitated, and discharged home.  She saw her gastroenterologist, Dr. Katrinka Blazing of [facility] in the clinic yesterday, upper endoscopy was recommended, but the patient declined at that time.  She now presents with progressive worsening in her dyspnea and lethargy, as well as persistent burning discomfort in the right upper quadrant.  Denies any bowel movement in the past few days and is no longer vomiting.  Denies any alcohol use but reports using Excedrin Migraine with aspirin about once a week.  ED Course: Upon arrival to the ED, patient is found to be afebrile, saturating well on room air, and with vitals otherwise normal.  CBC is notable for a hemoglobin of 7.4, down from 10.6 four days earlier, and down from 13.1 in June.  Fecal occult blood testing is positive.  INR is normal and chemistry panel is notable for mild hypokalemia.  CT of the abdomen and pelvis demonstrates inflammatory changes at the second portion of the duodenum, possibly reflecting peptic ulcer disease, but without fluid collection or  perforation identified patient was given a liter of normal saline and started on Protonix infusion in the ED.  She has remained hemodynamically stable with no tachycardia or hypotension, and is not had any she will be admitted to the telemetry unit for ongoing evaluation and management of symptomatic anemia, likely secondary to upper GI bleed.  Review of Systems:  All other systems reviewed and apart from HPI, are negative.  Past Medical History:  Diagnosis Date  . Anxiety   . Depression     Past Surgical History:  Procedure Laterality Date  . CHOLECYSTECTOMY       reports that  has never smoked. she has never used smokeless tobacco. She reports that she does not drink alcohol or use drugs.  Allergies  Allergen Reactions  . Reglan [Metoclopramide]     Suicidal ideations    History reviewed. No pertinent family history.   Prior to Admission medications   Medication Sig Start Date End Date Taking? Authorizing Provider  ALPRAZolam Prudy Feeler) 0.5 MG tablet Take 0.5 mg by mouth 3 (three) times daily as needed for anxiety.   Yes [provider]  aspirin-acetaminophen-caffeine (EXCEDRIN MIGRAINE) [phone] MG tablet Take 1-2 tablets by mouth every 6 (six) hours as needed for headache.   Yes [provider]  buPROPion (WELLBUTRIN XL) 300 MG 24 hr tablet Take 300 mg by mouth daily.    Yes [provider]  diphenoxylate-atropine (LOMOTIL) 2.5-0.025 MG tablet Take 1 tablet by mouth 4 (four) times daily as needed for diarrhea or loose stools. 12/12/17  Yes Fayrene Fearing,  Loraine LericheMark, MD  escitalopram (LEXAPRO) 20 MG tablet Take 20 mg by mouth daily.   Yes [provider]  ondansetron (ZOFRAN ODT) 4 MG disintegrating tablet Take 1 tablet (4 mg total) by mouth every 8 (eight) hours as needed for nausea. 12/12/17  Yes Rolland PorterJames, Mark, MD  zolpidem (AMBIEN) 5 MG tablet Take 20 mg by mouth at bedtime.    Yes [provider]    Physical Exam: Vitals:   12/16/17 2130  12/16/17 2145 12/16/17 2230 12/16/17 2300  BP: 136/70 139/67 125/66 (!) 126/56  Pulse: 87 86 89 94  Resp:      Temp:      TempSrc:      SpO2: 100% 99% 100% 100%      Constitutional: NAD, calm, pale, anxious Eyes: PERTLA, lids and conjunctivae normal ENMT: Mucous membranes are moist. Posterior pharynx clear of any exudate or lesions.   Neck: normal, supple, no masses, no thyromegaly Respiratory: clear to auscultation bilaterally, no wheezing, no crackles. Normal respiratory effort.   Cardiovascular: S1 & S2 heard, regular rate and rhythm. No extremity edema. No significant JVD. Abdomen: No distension, soft, tender in RUQ and epigastrium without rebound pain or guarding. Bowel sounds actove.  Musculoskeletal: no clubbing / cyanosis. No joint deformity upper and lower extremities.   Skin: no significant rashes, lesions, ulcers. Pale. Neurologic: CN 2-12 grossly intact. Sensation intact. Strength 5/5 in all 4 limbs.  Psychiatric: Alert and oriented x 3. Anxious, cooperative.     Labs on Admission: I have personally reviewed following labs and imaging studies  CBC: Recent Labs  Lab 12/12/17 1520 12/16/17 1951 12/16/17 1958  WBC 12.4* 7.4  --   NEUTROABS 10.2*  --  4.8  HGB 10.6* 7.4*  --   HCT 30.9* 22.0*  --   MCV 92.0 92.1  --   PLT 257 290  --    Basic Metabolic Panel: Recent Labs  Lab 12/12/17 1520 12/16/17 1951  NA 136 139  K 4.5 3.3*  CL 108 107  CO2 22 21*  GLUCOSE 128* 140*  BUN 43* 9  CREATININE 0.97 0.92  CALCIUM 8.4* 8.6*   GFR: Estimated Creatinine Clearance: 59.7 mL/min (by C-G formula based on SCr of 0.92 mg/dL). Liver Function Tests: Recent Labs  Lab 12/12/17 1520 12/16/17 1951  AST 15 23  ALT 11* 16  ALKPHOS 64 60  BILITOT 0.4 0.4  PROT 6.4* 5.9*  ALBUMIN 3.5 3.4*   Recent Labs  Lab 12/12/17 1520 12/16/17 1951  LIPASE 23 26   No results for input(s): AMMONIA in the last 168 hours. Coagulation Profile: Recent Labs  Lab  12/16/17 2156  INR 1.07   Cardiac Enzymes: Recent Labs  Lab 12/12/17 1932  TROPONINI <0.03   BNP (last 3 results) No results for input(s): PROBNP in the last 8760 hours. HbA1C: No results for input(s): HGBA1C in the last 72 hours. CBG: No results for input(s): GLUCAP in the last 168 hours. Lipid Profile: No results for input(s): CHOL, HDL, LDLCALC, TRIG, CHOLHDL, LDLDIRECT in the last 72 hours. Thyroid Function Tests: No results for input(s): TSH, T4TOTAL, FREET4, T3FREE, THYROIDAB in the last 72 hours. Anemia Panel: No results for input(s): VITAMINB12, FOLATE, FERRITIN, TIBC, IRON, RETICCTPCT in the last 72 hours. Urine analysis:    Component Value Date/Time   COLORURINE YELLOW 12/16/2017 2000   APPEARANCEUR CLEAR 12/16/2017 2000   LABSPEC 1.012 12/16/2017 2000   PHURINE 5.0 12/16/2017 2000   GLUCOSEU NEGATIVE 12/16/2017 2000   HGBUR  NEGATIVE 12/16/2017 2000   BILIRUBINUR NEGATIVE 12/16/2017 2000   KETONESUR 5 (A) 12/16/2017 2000   PROTEINUR NEGATIVE 12/16/2017 2000   NITRITE NEGATIVE 12/16/2017 2000   LEUKOCYTESUR TRACE (A) 12/16/2017 2000   Sepsis Labs: @LABRCNTIP (procalcitonin:4,lacticidven:4) )No results found for this or any previous visit (from the past 240 hour(s)).   Radiological Exams on Admission: Ct Abdomen Pelvis W Contrast  Result Date: 12/16/2017 CLINICAL DATA:  67 year old female with abdominal pain. EXAM: CT ABDOMEN AND PELVIS WITH CONTRAST TECHNIQUE: Multidetector CT imaging of the abdomen and pelvis was performed using the standard protocol following bolus administration of intravenous contrast. CONTRAST:  ISOVUE-300 IOPAMIDOL (ISOVUE-300) INJECTION 61% COMPARISON:  Lumbar spine radiograph dated 07/18/2017 FINDINGS: Lower chest: Minimal right lung base atelectasis/scarring. No intra-free air or free fluid. Hepatobiliary: Cholecystectomy. The liver is unremarkable. No intrahepatic biliary ductal dilatation. Pancreas: Unremarkable. No pancreatic  ductal dilatation or surrounding inflammatory changes. Spleen: Normal in size without focal abnormality. Adrenals/Urinary Tract: Adrenal glands are unremarkable. Kidneys are normal, without renal calculi, focal lesion, or hydronephrosis. Bladder is unremarkable. Stomach/Bowel: There is inflammatory changes of the second portion of the duodenum most consistent with duodenitis. Underlying peptic ulcer is not entirely excluded. Clinical correlation is recommended there are small scattered distal colonic diverticula without active inflammatory changes. No bowel obstruction. Normal appendix. Vascular/Lymphatic: Mild aortoiliac atherosclerotic disease. The origins of the celiac axis, SMA, IMA are patent. No portal venous gas. There is no adenopathy. Reproductive: The uterus and ovaries are grossly unremarkable. Other: Small fat containing umbilical hernia Musculoskeletal: Degenerative changes at L4-L5. No acute osseous pathology. IMPRESSION: 1. Inflammatory changes of the second portion of the duodenum may represent duodenitis. Peptic ulcer is not excluded. There is no fluid collection or perforation. 2. Sigmoid diverticulosis.  No bowel obstruction.  Normal appendix. 3. Mild Aortic Atherosclerosis (ICD10-I70.0). Electronically Signed   By: Elgie Collard M.D.   On: 12/16/2017 23:16    EKG: Not performed.   Assessment/Plan  1. Symptomatic anemia; upper GIB  - Presents with DOE and lethargy in setting of RUQ pain with N/V and dark tarry stool  - No hypotension or tachycardia, no BM since black tarry stool on 1/14, and no more vomiting  - Denies any alcohol use, reports use of Excedrin migraine (with ASA) about once weekly, no hx of GIB  - Hgb is 7.4 on admission, down from 10.6 four days earlier, and down from 13.1 in June '18  - FOBT is positive  - CT abd/pelvis with inflammatory changes involving 2nd portion of duodenum without fluid-collection or perforation  - She was started on Protonix infusion in ED   - Type and screen, transfuse one unit, check post-transfusion CBC, continue Protoinx infusion    2. Depression, anxiety, insomnia  - Continue Lexapro, prn Xanax, and prn Ambien    3. Migraines  - No HA on admission  - She manages this with prn Excedrin migraine at home, but counseled will likely need to avoid NSAID's going forward    4. Hypokalemia  - Serum potassium is 3.3 on admission  - KCl added to IVF, repeat chem panel in am     DVT prophylaxis: SCD's Code Status: Full  Family Communication: Discussed with patient Disposition Plan: Admit to telemetry Consults called: None Admission status: Inpatient    Briscoe Deutscher, MD [facility] Pager [phone]  If 7PM-7AM, please contact night-coverage [email or web address] Password [email or web address]  12/17/2017, 12:07 AM

## 2017-12-17 NOTE — Op Note (Signed)
[facility] Patient Name: Margaret Perry Procedure Date : 12/17/2017 MRN: [id number] Attending MD: Charna Elizabeth , MD Date of Birth: [birth year] CSN: [id number] Age: 67 Admit Type: Inpatient Procedure:                EGD with antral biopsies. Indications:              Iron deficiency anemia, Melena, Abnormal CT scan                            ?PUD in duodenum, RUQ pain.. Providers:                Charna Elizabeth, MD, Tomma Rakers, RN, Janie                            Billups, Technician. Referring MD:             THP Medicines:                Diphenhydramine 50 mg, Fentanyl 50 micrograms &                            Midazolam 5 mg IV. Complications:            No immediate complications. Estimated Blood Loss:     Estimated blood loss was minimal. Procedure:                Pre-anesthesia assessment: - Prior to the                            procedure, a history and physical was performed,                            and patient medications and allergies were                            reviewed. The patient's tolerance of previous                            anesthesia was also reviewed. The risks and                            benefits of the procedure and the sedation options                            and risks were discussed with the patient. All                            questions were answered, and informed consent was                            obtained. Prior anticoagulants: The patient has                            taken previous NSAID medication, last dose was 4  days prior to procedure. ASA Grade assessment: II -                            A patient with mild systemic disease. After                            reviewing the risks and benefits, the patient was                            deemed in satisfactory condition to undergo the                            procedure. After obtaining informed consent, the                            endoscope  was passed under direct vision.                            Throughout the procedure, the patient's blood                            pressure, pulse, and oxygen saturations were                            monitored continuously. The [id number] ([id number])                            scope was introduced through the mouth, and                            advanced to the second part of duodenum. The EGD                            was accomplished without difficulty. The patient                            tolerated the procedure well. Scope In: Scope Out: Findings:      The examined esophagus and GEJ appeared widely patent and normal.      Patchy mild inflammation characterized by erythema was found in the       gastric antrum. Biopsies were taken with a cold forceps for histology.      The cardia and gastric fundus were normal on retroflexion.      One non-bleeding cratered duodenal ulcer with no stigmata of bleeding       was found in the duodenal bulb; the lesion was 10 mm in largest       dimension; no visible vessel was noted.      The second portion of the duodenum was normal. Impression:               - Normal appearing, widely patent esophagus.                           - Mild antral gastritis-biopsied to rule out H.  pylori by pathology.                           - One large non-bleeding duodenal ulcer with no                            stigmata of bleeding noted in the post-bulbar                            region.                           - Normal second portion of the duodenum. Moderate Sedation:      Moderate (conscious) sedation was administered by the endoscopy nurse       and supervised by the endoscopist. The following parameters were       monitored: oxygen saturation, heart rate, blood pressure, respiratory       rate, EKG, adequacy of pulmonary ventilation, and response to care.       Total physician intraservice time was 12  minutes. Recommendation:           - Clear liquid diet today-advance to full liquid as                            tolerated.                           - Continue present medications.                           - Await pathology results.                           - No Ibuprofen, Naproxen, or other non-steroidal                            anti-inflammatory drugs for now.                           - Return to my office in 2 weeks. Procedure Code(s):        --- Professional ---                           [phone], Esophagogastroduodenoscopy, flexible,                            transoral; with biopsy, single or multiple Diagnosis Code(s):        --- Professional ---                           K92.1, Melena (includes Hematochezia)                           D50.9, Iron deficiency anemia, unspecified                           K26.9, Duodenal ulcer, unspecified as acute or  chronic, without hemorrhage or perforation                           R93.3, Abnormal findings on diagnostic imaging of                            other parts of digestive tract                           K29.70, Gastritis, unspecified, without bleeding                           R10.11, Right upper quadrant pain CPT copyright 2016 [employer]. All rights reserved. The codes documented in this report are preliminary and upon coder review may  be revised to meet current compliance requirements. Charna ElizabethJyothi Witt Plitt, MD Charna ElizabethJyothi Ameliyah Sarno, MD 12/17/2017 4:35:13 PM This report has been signed electronically. Number of Addenda: 0

## 2017-12-17 NOTE — Progress Notes (Signed)
Pt admitted to [id number] from ED via stretcher. Denies any pain and is alert and oriented x4. 1 unit of RBC  and 1st K bag  Infusing ( at sperate IV sites) .  Pt able to walk from stretcher to bed, however, unsteady gait. Pt was oriented to room, call light and phone within reach. Educated to call before getting up.   Will continue to monitor.

## 2017-12-17 NOTE — Consult Note (Addendum)
UNASSIGNED PATIENT Reason for Consult: Black stools and severe anemia. Referring Physician: THP.  Margaret Perry is an 67 y.o. female.  HPI: Margaret Perry is a 67 year old white female with multiple with a history of migraine headaches who admits to using Excedrin Migraine occasionally about once a week. She also has a history of depression and insomnia and anxiety.. She presented to the emergency room on 12/12/2017 with a history of shortness of breath and generalized malaise and fatigue and was resuscitated with IV fluids and sent home at that time her hemoglobin was 10.6 g/dL down from 13.1 with the summer of 2018. She saw her primary gastroenterologist Dr. Tamala Julian in [place] on Thursday, 12/14/2017 when endoscopy was advised but the patient said she did not feel well and left to go home without having the procedure. She presented to the emergency room at [facility] with worsening symptoms shortness of breath and lethargy yesterday and was found to have a hemoglobin of 7.4 g/dL. She received 1 unit of packed red blood cells with a hemoglobin remains unchanged she complains of right upper quadrant pain that she's had for the last week with nausea and vomiting but denies any frank hematemesis or coffee-ground emesis. There is no history of hematochezia but she's had melenic stools for the last 5 days. She denies a previous history of ulcers jaundice or colitis. She gives a history of having a colonoscopy done by her gastroenterologist in [place] about 3 years ago that was reportedly normal. I do not have any access to those records at this time. She's been progressively short of breath over the last week and denies having such symptoms in the past. She denies any major cardiorespiratory issues. In the ER she had a CT scan of the abdomen and pelvis that showed some duodenitis in the second portion of the duodenum and duodenum peptic ulcer disease was not ruled out. There is no evidence of perforation.  She's also noted to have sigmoid diverticulosis on her CT scan along with some degenerative disc disease in L4-L5 region. A small complex hernia containing fat was also noted.  Past Medical History:  Diagnosis Date  . Anxiety   . Depression    Past Surgical History:  Procedure Laterality Date  . CHOLECYSTECTOMY     History reviewed. No pertinent family history.  Social History:  reports that  has never smoked. she has never used smokeless tobacco. She reports that she does not drink alcohol or use drugs.  Allergies:  Allergies  Allergen Reactions  . Reglan [Metoclopramide]     Suicidal ideations   Medications: I have reviewed the patient's current medications.  Results for orders placed or performed during the hospital encounter of 12/16/17 (from the past 48 hour(s))  Lipase, blood     Status: None   Collection Time: 12/16/17  7:51 PM  Result Value Ref Range   Lipase 26 11 - 51 U/L  Comprehensive metabolic panel     Status: Abnormal   Collection Time: 12/16/17  7:51 PM  Result Value Ref Range   Sodium 139 135 - 145 mmol/L   Potassium 3.3 (L) 3.5 - 5.1 mmol/L   Chloride 107 101 - 111 mmol/L   CO2 21 (L) 22 - 32 mmol/L   Glucose, Bld 140 (H) 65 - 99 mg/dL   BUN 9 6 - 20 mg/dL   Creatinine, Ser 0.92 0.44 - 1.00 mg/dL   Calcium 8.6 (L) 8.9 - 10.3 mg/dL   Total Protein 5.9 (L) 6.5 -  8.1 g/dL   Albumin 3.4 (L) 3.5 - 5.0 g/dL   AST 23 15 - 41 U/L   ALT 16 14 - 54 U/L   Alkaline Phosphatase 60 38 - 126 U/L   Total Bilirubin 0.4 0.3 - 1.2 mg/dL   GFR calc non Af Amer >60 >60 mL/min   GFR calc Af Amer >60 >60 mL/min    Comment: (NOTE) The eGFR has been calculated using the CKD EPI equation. This calculation has not been validated in all clinical situations. eGFR's persistently <60 mL/min signify possible Chronic Kidney Disease.    Anion gap 11 5 - 15  CBC     Status: Abnormal   Collection Time: 12/16/17  7:51 PM  Result Value Ref Range   WBC 7.4 4.0 - 10.5 K/uL   RBC  2.39 (L) 3.87 - 5.11 MIL/uL   Hemoglobin 7.4 (L) 12.0 - 15.0 g/dL   HCT 22.0 (L) 36.0 - 46.0 %   MCV 92.1 78.0 - 100.0 fL   MCH 31.0 26.0 - 34.0 pg   MCHC 33.6 30.0 - 36.0 g/dL   RDW 14.5 11.5 - 15.5 %   Platelets 290 150 - 400 K/uL  Differential     Status: None   Collection Time: 12/16/17  7:58 PM  Result Value Ref Range   Neutrophils Relative % 67 %   Neutro Abs 4.8 1.7 - 7.7 K/uL   Lymphocytes Relative 24 %   Lymphs Abs 1.7 0.7 - 4.0 K/uL   Monocytes Relative 8 %   Monocytes Absolute 0.5 0.1 - 1.0 K/uL   Eosinophils Relative 1 %   Eosinophils Absolute 0.1 0.0 - 0.7 K/uL   Basophils Relative 0 %   Basophils Absolute 0.0 0.0 - 0.1 K/uL  Urinalysis, Routine w reflex microscopic     Status: Abnormal   Collection Time: 12/16/17  8:00 PM  Result Value Ref Range   Color, Urine YELLOW YELLOW   APPearance CLEAR CLEAR   Specific Gravity, Urine 1.012 1.005 - 1.030   pH 5.0 5.0 - 8.0   Glucose, UA NEGATIVE NEGATIVE mg/dL   Hgb urine dipstick NEGATIVE NEGATIVE   Bilirubin Urine NEGATIVE NEGATIVE   Ketones, ur 5 (A) NEGATIVE mg/dL   Protein, ur NEGATIVE NEGATIVE mg/dL   Nitrite NEGATIVE NEGATIVE   Leukocytes, UA TRACE (A) NEGATIVE   RBC / HPF NONE SEEN 0 - 5 RBC/hpf   WBC, UA 6-30 0 - 5 WBC/hpf   Bacteria, UA NONE SEEN NONE SEEN   Squamous Epithelial / LPF 0-5 (A) NONE SEEN   Mucus PRESENT   POC occult blood, ED Provider will collect     Status: Abnormal   Collection Time: 12/16/17  9:44 PM  Result Value Ref Range   Fecal Occult Bld POSITIVE (A) NEGATIVE  Protime-INR     Status: None   Collection Time: 12/16/17  9:56 PM  Result Value Ref Range   Prothrombin Time 13.8 11.4 - 15.2 seconds   INR 1.07   Type and screen     Status: None (Preliminary result)   Collection Time: 12/16/17 10:00 PM  Result Value Ref Range   ABO/RH(D) O POS    Antibody Screen NEG    Sample Expiration 12/19/2017    Unit Number [id number]    Blood Component Type RED CELLS,LR    Unit division 00     Status of Unit ISSUED    Transfusion Status OK TO TRANSFUSE    Crossmatch Result Compatible    Unit  Number [id number]    Blood Component Type RED CELLS,LR    Unit division 00    Status of Unit ALLOCATED    Transfusion Status OK TO TRANSFUSE    Crossmatch Result Compatible    Unit Number [id number]    Blood Component Type RBC LR PHER1    Unit division 00    Status of Unit ISSUED    Transfusion Status OK TO TRANSFUSE    Crossmatch Result Compatible   ABO/Rh     Status: None   Collection Time: 12/16/17 10:00 PM  Result Value Ref Range   ABO/RH(D) O POS   Prepare RBC     Status: None   Collection Time: 12/17/17 12:03 AM  Result Value Ref Range   Order Confirmation ORDER PROCESSED BY BLOOD BANK   Basic metabolic panel     Status: Abnormal   Collection Time: 12/17/17  5:00 AM  Result Value Ref Range   Sodium 141 135 - 145 mmol/L   Potassium 3.4 (L) 3.5 - 5.1 mmol/L   Chloride 111 101 - 111 mmol/L   CO2 21 (L) 22 - 32 mmol/L   Glucose, Bld 108 (H) 65 - 99 mg/dL   BUN 8 6 - 20 mg/dL   Creatinine, Ser 0.80 0.44 - 1.00 mg/dL   Calcium 7.8 (L) 8.9 - 10.3 mg/dL   GFR calc non Af Amer >60 >60 mL/min   GFR calc Af Amer >60 >60 mL/min    Comment: (NOTE) The eGFR has been calculated using the CKD EPI equation. This calculation has not been validated in all clinical situations. eGFR's persistently <60 mL/min signify possible Chronic Kidney Disease.    Anion gap 9 5 - 15  CBC     Status: Abnormal   Collection Time: 12/17/17  5:00 AM  Result Value Ref Range   WBC 6.6 4.0 - 10.5 K/uL   RBC 2.35 (L) 3.87 - 5.11 MIL/uL   Hemoglobin 7.4 (L) 12.0 - 15.0 g/dL   HCT 22.0 (L) 36.0 - 46.0 %   MCV 93.6 78.0 - 100.0 fL   MCH 31.5 26.0 - 34.0 pg   MCHC 33.6 30.0 - 36.0 g/dL   RDW 15.0 11.5 - 15.5 %   Platelets 200 150 - 400 K/uL  Prepare RBC     Status: None   Collection Time: 12/17/17 11:56 AM  Result Value Ref Range   Order Confirmation ORDER PROCESSED BY BLOOD BANK    Ct  Abdomen Pelvis W Contrast  Result Date: 12/16/2017 CLINICAL DATA:  67 year old female with abdominal pain. EXAM: CT ABDOMEN AND PELVIS WITH CONTRAST TECHNIQUE: Multidetector CT imaging of the abdomen and pelvis was performed using the standard protocol following bolus administration of intravenous contrast. CONTRAST:  133m ISOVUE-300 IOPAMIDOL (ISOVUE-300) INJECTION 61% COMPARISON:  Lumbar spine radiograph dated 07/18/2017 FINDINGS: Lower chest: Minimal right lung base atelectasis/scarring. No intra-free air or free fluid. Hepatobiliary: Cholecystectomy. The liver is unremarkable. No intrahepatic biliary ductal dilatation. Pancreas: Unremarkable. No pancreatic ductal dilatation or surrounding inflammatory changes. Spleen: Normal in size without focal abnormality. Adrenals/Urinary Tract: Adrenal glands are unremarkable. Kidneys are normal, without renal calculi, focal lesion, or hydronephrosis. Bladder is unremarkable. Stomach/Bowel: There is inflammatory changes of the second portion of the duodenum most consistent with duodenitis. Underlying peptic ulcer is not entirely excluded. Clinical correlation is recommended there are small scattered distal colonic diverticula without active inflammatory changes. No bowel obstruction. Normal appendix. Vascular/Lymphatic: Mild aortoiliac atherosclerotic disease. The origins of the celiac axis, SMA, IMA are  patent. No portal venous gas. There is no adenopathy. Reproductive: The uterus and ovaries are grossly unremarkable. Other: Small fat containing umbilical hernia Musculoskeletal: Degenerative changes at L4-L5. No acute osseous pathology. IMPRESSION: 1. Inflammatory changes of the second portion of the duodenum may represent duodenitis. Peptic ulcer is not excluded. There is no fluid collection or perforation. 2. Sigmoid diverticulosis.  No bowel obstruction.  Normal appendix. 3. Mild Aortic Atherosclerosis (ICD10-I70.0). Electronically Signed   By: Anner Crete M.D.    On: 12/16/2017 23:16   Review of Systems  Constitutional: Positive for malaise/fatigue. Negative for chills, diaphoresis, fever and weight loss.  HENT: Negative.   Eyes: Negative.   Respiratory: Positive for shortness of breath. Negative for cough, hemoptysis, sputum production and wheezing.   Cardiovascular: Negative.   Gastrointestinal: Positive for abdominal pain, blood in stool, melena and nausea. Negative for constipation, diarrhea and heartburn.  Genitourinary: Negative.   Musculoskeletal: Negative.  Negative for joint pain.  Skin: Negative.   Neurological: Positive for dizziness, weakness and headaches. Negative for tingling, tremors, sensory change, speech change, focal weakness, seizures and loss of consciousness.  Endo/Heme/Allergies: Negative.   Psychiatric/Behavioral: Positive for depression. Negative for hallucinations, memory loss, substance abuse and suicidal ideas. The patient is nervous/anxious and has insomnia.    Blood pressure 120/64, pulse 76, temperature 98.3 F (36.8 C), temperature source Oral, resp. rate 18, SpO2 97 %. Physical Exam  Constitutional: She is oriented to person, place, and time. She appears well-developed and well-nourished.  HENT:  Head: Normocephalic and atraumatic.  Eyes: Conjunctivae and EOM are normal. Pupils are equal, round, and reactive to light.  Neck: Normal range of motion. Neck supple.  Cardiovascular: Normal rate and regular rhythm.  Respiratory: Effort normal and breath sounds normal.  GI: Soft. Bowel sounds are normal.  Neurological: She is alert and oriented to person, place, and time.  Skin: Skin is warm and dry.  Psychiatric: She has a normal mood and affect. Her behavior is normal. Judgment and thought content normal.   Assessment/Plan: 1) Melenic stools, RUQ pain, duodenitis on CT scan, with severe symptomatic anemia-hemoglobin is now 7.4 gm/dl. BUN is normal. Plans are to transfuse her with 2 units of packed red blood cells  and do an endoscopy this evening. We'll allow patient some ice chips for now.Marland Kitchen   2) Sigmoid diverticulosis on CT scan 3) History of depression anxiety and insomnia 0  4) History of migraine headaches-uses Excedrin but once a week.  5) Umbilical hernia containing fat.   Burnetta Kohls 12/17/2017, 1:28 PM

## 2017-12-18 ENCOUNTER — Encounter (HOSPITAL_COMMUNITY): Payer: Self-pay | Admitting: Gastroenterology

## 2017-12-18 LAB — CBC WITH DIFFERENTIAL/PLATELET
Basophils Absolute: 0 10*3/uL (ref 0.0–0.1)
Basophils Relative: 0 %
Eosinophils Absolute: 0.3 10*3/uL (ref 0.0–0.7)
Eosinophils Relative: 4 %
HEMATOCRIT: 30.3 % — AB (ref 36.0–46.0)
Hemoglobin: 10.1 g/dL — ABNORMAL LOW (ref 12.0–15.0)
LYMPHS PCT: 22 %
Lymphs Abs: 1.4 10*3/uL (ref 0.7–4.0)
MCH: 30.6 pg (ref 26.0–34.0)
MCHC: 33.3 g/dL (ref 30.0–36.0)
MCV: 91.8 fL (ref 78.0–100.0)
MONO ABS: 0.6 10*3/uL (ref 0.1–1.0)
MONOS PCT: 10 %
NEUTROS ABS: 4.2 10*3/uL (ref 1.7–7.7)
Neutrophils Relative %: 64 %
PLATELETS: 205 10*3/uL (ref 150–400)
RBC: 3.3 MIL/uL — ABNORMAL LOW (ref 3.87–5.11)
RDW: 16.6 % — AB (ref 11.5–15.5)
WBC: 6.6 10*3/uL (ref 4.0–10.5)

## 2017-12-18 LAB — TYPE AND SCREEN
ABO/RH(D): O POS
Antibody Screen: NEGATIVE
UNIT DIVISION: 0
UNIT DIVISION: 0
Unit division: 0

## 2017-12-18 LAB — BPAM RBC
BLOOD PRODUCT EXPIRATION DATE: 201902142359
BLOOD PRODUCT EXPIRATION DATE: 201902142359
Blood Product Expiration Date: 201902142359
ISSUE DATE / TIME: 201901200045
ISSUE DATE / TIME: 201901201545
ISSUE DATE / TIME: 201901201251
UNIT TYPE AND RH: 5100
UNIT TYPE AND RH: 5100
UNIT TYPE AND RH: 5100

## 2017-12-18 LAB — BASIC METABOLIC PANEL
ANION GAP: 9 (ref 5–15)
BUN: 7 mg/dL (ref 6–20)
CHLORIDE: 110 mmol/L (ref 101–111)
CO2: 21 mmol/L — ABNORMAL LOW (ref 22–32)
Calcium: 8.4 mg/dL — ABNORMAL LOW (ref 8.9–10.3)
Creatinine, Ser: 0.86 mg/dL (ref 0.44–1.00)
Glucose, Bld: 100 mg/dL — ABNORMAL HIGH (ref 65–99)
POTASSIUM: 3.3 mmol/L — AB (ref 3.5–5.1)
SODIUM: 140 mmol/L (ref 135–145)

## 2017-12-18 LAB — GLUCOSE, CAPILLARY: GLUCOSE-CAPILLARY: 86 mg/dL (ref 65–99)

## 2017-12-18 LAB — HEMOGLOBIN AND HEMATOCRIT, BLOOD
HCT: 30.2 % — ABNORMAL LOW (ref 36.0–46.0)
HEMOGLOBIN: 10.1 g/dL — AB (ref 12.0–15.0)

## 2017-12-18 MED ORDER — PANTOPRAZOLE SODIUM 40 MG PO TBEC
40.0000 mg | DELAYED_RELEASE_TABLET | Freq: Two times a day (BID) | ORAL | Status: DC
  Administered 2017-12-18 – 2017-12-19 (×3): 40 mg via ORAL
  Filled 2017-12-18 (×3): qty 1

## 2017-12-18 MED ORDER — POTASSIUM CHLORIDE 10 MEQ/50ML IV SOLN
10.0000 meq | INTRAVENOUS | Status: DC | PRN
  Filled 2017-12-18: qty 50

## 2017-12-18 NOTE — Progress Notes (Signed)
Patient's daughter(Lyn) called,gave  right password number.Gave very little information and assured her mother is in good,stable condition.Mentioned to her that her mother was upset early this morning and the reason why.From her,the writer was able to know that Lura Ematsy was her friend and her mother has difficulty of hearing.

## 2017-12-18 NOTE — Progress Notes (Signed)
Patient came to the desk,upset becouse family members knows medical condition.Earlier her daughter called here,spoke with the nurse.The writer asked her relationship with the patient and her name is Merchandiser, retailatsy, her daughter, she gave me the password number but the nurse unable to find the this, in patient's chart,asked the caller to hold the line and the nurse went into the patient's room and nurse told her the her daughter is on the line and if it is alright to give some information to her,patient responded affirmatively to give update to her daughter.

## 2017-12-18 NOTE — Progress Notes (Signed)
Patient is sleeping  

## 2017-12-18 NOTE — Progress Notes (Signed)
PROGRESS NOTE    Margaret Perry  ZOX:[id number] DOB: 30-Oct-1951 DOA: 12/16/2017 PCP: Herma Carson, MD  Brief Narrative: 67 y.o. female with medical history significant for migraines, anxiety, depression, and insomnia, now presenting to the emergency department with right upper quadrant abdominal pain, exertional dyspnea, lethargy, and pallor.  Patient reports that she had been in her usual state of health until approximately 5 days ago when she noted a burning discomfort in the right upper quadrant with mild nausea and one episode of vomiting which may have contained some specks of red blood.  She then had a bowel movement described as black and tarry.  Since that time, she has noted progressive exertional dyspnea and lethargy.  She was seen in the emergency department 1 day after the black tarry stool, was fluid resuscitated, and discharged home.  She saw her gastroenterologist, Dr. Katrinka Blazing of [facility] in the clinic yesterday, upper endoscopy was recommended, but the patient declined at that time.  She now presents with progressive worsening in her dyspnea and lethargy, as well as persistent burning discomfort in the right upper quadrant.  Denies any bowel movement in the past few days and is no longer vomiting.  Denies any alcohol use but reports using Excedrin Migraine with aspirin about once a week.  ED Course: Upon arrival to the ED, patient is found to be afebrile, saturating well on room air, and with vitals otherwise normal.  CBC is notable for a hemoglobin of 7.4, down from 10.6 four days earlier, and down from 13.1 in June.  Fecal occult blood testing is positive.  INR is normal and chemistry panel is notable for mild hypokalemia.  CT of the abdomen and pelvis demonstrates inflammatory changes at the second portion of the duodenum, possibly reflecting peptic ulcer disease, but without fluid collection or perforation identified patient was given a liter of normal saline and started on Protonix infusion  in the ED.  She has remained hemodynamically stable with no tachycardia or hypotension, and is not had any she will be admitted to the telemetry unit for ongoing evaluation and management of symptomatic anemia, likely secondary to upper GI bleed.     Assessment & Plan:   Principal Problem:   Symptomatic anemia Active Problems:   Depression   Anxiety   Hypokalemia   Upper GI bleed   Insomnia   Migraines  1. Symptomatic anemia; upper GIB  - Presents with DOE and lethargy in setting of RUQ pain with N/V and dark tarry stool  - No hypotension or tachycardia, no BM since black tarry stool on 1/14, and no more vomiting  - Denies any alcohol use, reports use of Excedrin migraine (with ASA) about once weekly, no hx of GIB  - Hgb is 7.4 on admission, down from 10.6 four days earlier, and down from 13.1 in June '18  - FOBT is positive  - CT abd/pelvis with inflammatory changes involving 2nd portion of duodenum without fluid-collection or perforation  - She was started on Protonix infusion in ED  - Type and screen, transfused 3 unit. EGD-mild gastritis biopsy was done.  Large nonbleeding duodenal ulcer with no stigmata of recent bleeding.  Normal second portion of the duodenum.  Will start p.o. Protonix.  Increase her diet to full liquid and then regular diet.  Patient will follow up with GI in 2 weeks after discharge.  Her hemoglobin is 10.1 after 3 units of blood transfusion.  2. Depression, anxiety, insomnia  - Continue Lexapro, prn Xanax, and  prn Ambien    3. Migraines  - No HA on admission  - She manages this with prn Excedrin migraine at home, but counseled will likely need to avoid NSAID's going forward    4. Hypokalemia  - Serum potassium is 3.3 replete.      DVT prophylaxis:scd Code Status:full Family Communication:none Disposition Plan:  DC tomorrow if she remains stable. Consultants:  GI Procedures: EGD Antimicrobials: None  Subjective: Denies any nausea  vomiting or diarrhea. no Further guaiac positive stools.   Objective: Vitals:   12/17/17 1840 12/17/17 2112 12/18/17 0434 12/18/17 1014  BP: (!) 102/46 (!) 114/54 (!) 99/52 (!) 121/54  Pulse:  67 87 68  Resp:  16 16 17   Temp:  99.4 F (37.4 C) 98.1 F (36.7 C) 98 F (36.7 C)  TempSrc:  Oral Oral Oral  SpO2:  99% 97% 96%  Weight:  84.5 kg (186 lb 3.2 oz)    Height:        Intake/Output Summary (Last 24 hours) at 12/18/2017 1321 Last data filed at 12/18/2017 1000 Gross per 24 hour  Intake 1773 ml  Output 0 ml  Net 1773 ml   Filed Weights   12/17/17 1737 12/17/17 2112  Weight: 84.5 kg (186 lb 3.2 oz) 84.5 kg (186 lb 3.2 oz)    Examination:  General exam: Appears calm and comfortable  Respiratory system: Clear to auscultation. Respiratory effort normal. Cardiovascular system: S1 & S2 heard, RRR. No JVD, murmurs, rubs, gallops or clicks. No pedal edema. Gastrointestinal system: Abdomen is nondistended, soft and nontender. No organomegaly or masses felt. Normal bowel sounds heard. Central nervous system: Alert and oriented. No focal neurological deficits. Extremities: Symmetric 5 x 5 power. Skin: No rashes, lesions or ulcers Psychiatry: Judgement and insight appear normal. Mood & affect appropriate.     Data Reviewed: I have personally reviewed following labs and imaging studies  CBC: Recent Labs  Lab 12/12/17 1520 12/16/17 1951 12/16/17 1958 12/17/17 0500 12/17/17 2052 12/18/17 0947  WBC 12.4* 7.4  --  6.6  --  6.6  NEUTROABS 10.2*  --  4.8  --   --  4.2  HGB 10.6* 7.4*  --  7.4* 9.9* 10.1*  10.1*  HCT 30.9* 22.0*  --  22.0* 30.2* 30.3*  30.2*  MCV 92.0 92.1  --  93.6  --  91.8  PLT 257 290  --  200  --  205   Basic Metabolic Panel: Recent Labs  Lab 12/12/17 1520 12/16/17 1951 12/17/17 0500 12/18/17 0947  NA 136 139 141 140  K 4.5 3.3* 3.4* 3.3*  CL 108 107 111 110  CO2 22 21* 21* 21*  GLUCOSE 128* 140* 108* 100*  BUN 43* 9 8 7   CREATININE 0.97  0.92 0.80 0.86  CALCIUM 8.4* 8.6* 7.8* 8.4*   GFR: Estimated Creatinine Clearance: 66.7 mL/min (by C-G formula based on SCr of 0.86 mg/dL). Liver Function Tests: Recent Labs  Lab 12/12/17 1520 12/16/17 1951  AST 15 23  ALT 11* 16  ALKPHOS 64 60  BILITOT 0.4 0.4  PROT 6.4* 5.9*  ALBUMIN 3.5 3.4*   Recent Labs  Lab 12/12/17 1520 12/16/17 1951  LIPASE 23 26   No results for input(s): AMMONIA in the last 168 hours. Coagulation Profile: Recent Labs  Lab 12/16/17 2156  INR 1.07   Cardiac Enzymes: Recent Labs  Lab 12/12/17 1932  TROPONINI <0.03   BNP (last 3 results) No results for input(s): PROBNP in the last 8760  hours. HbA1C: No results for input(s): HGBA1C in the last 72 hours. CBG: Recent Labs  Lab 12/18/17 0743  GLUCAP 86   Lipid Profile: No results for input(s): CHOL, HDL, LDLCALC, TRIG, CHOLHDL, LDLDIRECT in the last 72 hours. Thyroid Function Tests: No results for input(s): TSH, T4TOTAL, FREET4, T3FREE, THYROIDAB in the last 72 hours. Anemia Panel: No results for input(s): VITAMINB12, FOLATE, FERRITIN, TIBC, IRON, RETICCTPCT in the last 72 hours. Sepsis Labs: Recent Labs  Lab 12/12/17 1535  LATICACIDVEN 0.71    No results found for this or any previous visit (from the past 240 hour(s)).       Radiology Studies: Ct Abdomen Pelvis W Contrast  Result Date: 12/16/2017 CLINICAL DATA:  67 year old female with abdominal pain. EXAM: CT ABDOMEN AND PELVIS WITH CONTRAST TECHNIQUE: Multidetector CT imaging of the abdomen and pelvis was performed using the standard protocol following bolus administration of intravenous contrast. CONTRAST:  100mL ISOVUE-300 IOPAMIDOL (ISOVUE-300) INJECTION 61% COMPARISON:  Lumbar spine radiograph dated 07/18/2017 FINDINGS: Lower chest: Minimal right lung base atelectasis/scarring. No intra-free air or free fluid. Hepatobiliary: Cholecystectomy. The liver is unremarkable. No intrahepatic biliary ductal dilatation. Pancreas:  Unremarkable. No pancreatic ductal dilatation or surrounding inflammatory changes. Spleen: Normal in size without focal abnormality. Adrenals/Urinary Tract: Adrenal glands are unremarkable. Kidneys are normal, without renal calculi, focal lesion, or hydronephrosis. Bladder is unremarkable. Stomach/Bowel: There is inflammatory changes of the second portion of the duodenum most consistent with duodenitis. Underlying peptic ulcer is not entirely excluded. Clinical correlation is recommended there are small scattered distal colonic diverticula without active inflammatory changes. No bowel obstruction. Normal appendix. Vascular/Lymphatic: Mild aortoiliac atherosclerotic disease. The origins of the celiac axis, SMA, IMA are patent. No portal venous gas. There is no adenopathy. Reproductive: The uterus and ovaries are grossly unremarkable. Other: Small fat containing umbilical hernia Musculoskeletal: Degenerative changes at L4-L5. No acute osseous pathology. IMPRESSION: 1. Inflammatory changes of the second portion of the duodenum may represent duodenitis. Peptic ulcer is not excluded. There is no fluid collection or perforation. 2. Sigmoid diverticulosis.  No bowel obstruction.  Normal appendix. 3. Mild Aortic Atherosclerosis (ICD10-I70.0). Electronically Signed   By: Elgie CollardArash  Radparvar M.D.   On: 12/16/2017 23:16        Scheduled Meds: . buPROPion  300 mg Oral Daily  . escitalopram  20 mg Oral Daily  . sodium chloride flush  3 mL Intravenous Q12H   Continuous Infusions: . sodium chloride    . sodium chloride    . pantoprozole (PROTONIX) infusion 8 mg/hr (12/18/17 1042)     LOS: 1 day     Alwyn RenElizabeth G Mathews, MD [facility]  If 7PM-7AM, please contact night-coverage [email or web address] Password [email or web address] 12/18/2017, 1:21 PM

## 2017-12-18 NOTE — Progress Notes (Signed)
Patient states a few years she took Lomotil often for her IBS,  causing her to be hospitalized in conjunction with her Srojens to have Parotid blockage, infection and general dehydration.   Her GI MD switched her to Lotronex.   she has been taking Lotronex 0.5mg  QD PO for several months (probably 6 months) ending in December 2018.  This med was taken off the market and reapproved by FDA so that only women could be prescribed for IBS.  Pateint asking if there is some correlation of her symptoms toady with taking this med.

## 2017-12-19 LAB — CBC WITH DIFFERENTIAL/PLATELET
BASOS ABS: 0 10*3/uL (ref 0.0–0.1)
Basophils Relative: 0 %
EOS PCT: 3 %
Eosinophils Absolute: 0.3 10*3/uL (ref 0.0–0.7)
HEMATOCRIT: 29.4 % — AB (ref 36.0–46.0)
Hemoglobin: 9.7 g/dL — ABNORMAL LOW (ref 12.0–15.0)
LYMPHS PCT: 26 %
Lymphs Abs: 2.1 10*3/uL (ref 0.7–4.0)
MCH: 30.6 pg (ref 26.0–34.0)
MCHC: 33 g/dL (ref 30.0–36.0)
MCV: 92.7 fL (ref 78.0–100.0)
MONOS PCT: 9 %
Monocytes Absolute: 0.7 10*3/uL (ref 0.1–1.0)
NEUTROS ABS: 5 10*3/uL (ref 1.7–7.7)
Neutrophils Relative %: 62 %
PLATELETS: 228 10*3/uL (ref 150–400)
RBC: 3.17 MIL/uL — ABNORMAL LOW (ref 3.87–5.11)
RDW: 16.8 % — AB (ref 11.5–15.5)
WBC: 8.1 10*3/uL (ref 4.0–10.5)

## 2017-12-19 LAB — BASIC METABOLIC PANEL
Anion gap: 14 (ref 5–15)
BUN: 8 mg/dL (ref 6–20)
CALCIUM: 8.5 mg/dL — AB (ref 8.9–10.3)
CHLORIDE: 108 mmol/L (ref 101–111)
CO2: 20 mmol/L — AB (ref 22–32)
CREATININE: 0.93 mg/dL (ref 0.44–1.00)
GFR calc non Af Amer: >60 mL/min (ref >60)
GLUCOSE: 138 mg/dL — AB (ref 65–99)
Potassium: 3.1 mmol/L — ABNORMAL LOW (ref 3.5–5.1)
Sodium: 142 mmol/L (ref 135–145)

## 2017-12-19 LAB — CBC
HCT: 32.1 % — ABNORMAL LOW (ref 36.0–46.0)
Hemoglobin: 10.7 g/dL — ABNORMAL LOW (ref 12.0–15.0)
MCH: 30.7 pg (ref 26.0–34.0)
MCHC: 33.3 g/dL (ref 30.0–36.0)
MCV: 92.2 fL (ref 78.0–100.0)
PLATELETS: 248 10*3/uL (ref 150–400)
RBC: 3.48 MIL/uL — AB (ref 3.87–5.11)
RDW: 16.9 % — AB (ref 11.5–15.5)
WBC: 5 10*3/uL (ref 4.0–10.5)

## 2017-12-19 LAB — GLUCOSE, CAPILLARY: Glucose-Capillary: 96 mg/dL (ref 65–99)

## 2017-12-19 MED ORDER — PANTOPRAZOLE SODIUM 40 MG PO TBEC: 40.0000 mg | DELAYED_RELEASE_TABLET | Freq: Two times a day (BID) | ORAL | 0 refills | Status: AC

## 2017-12-19 MED ORDER — TRAMADOL HCL 50 MG PO TABS: 50.0000 mg | ORAL_TABLET | Freq: Four times a day (QID) | ORAL | Status: DC | PRN

## 2017-12-19 MED ORDER — OXYCODONE HCL 5 MG PO TABS: 5.0000 mg | ORAL_TABLET | ORAL | Status: DC | PRN

## 2017-12-19 MED ORDER — DIPHENOXYLATE-ATROPINE 2.5-0.025 MG PO TABS
1.0000 | ORAL_TABLET | Freq: Once | ORAL | Status: AC
  Administered 2017-12-19: 1 via ORAL
  Filled 2017-12-19: qty 1

## 2017-12-19 MED ORDER — POTASSIUM CHLORIDE 10 MEQ/100ML IV SOLN
10.0000 meq | INTRAVENOUS | Status: AC
Start: 2017-12-19 — End: 2017-12-19
  Administered 2017-12-19 (×4): 10 meq via INTRAVENOUS
  Filled 2017-12-19 (×4): qty 100

## 2017-12-19 NOTE — Progress Notes (Signed)
Patient discharged to home with son. IV's removed. Telemetry removed. All instrucitons reviewed with the patient. All followup appts reviewed with the patient. New medications reviewed with the patient. Patient left unit in stable condition.  Avelina LaineKimberly Stachia Slutsky RN

## 2017-12-19 NOTE — Discharge Summary (Signed)
Physician Discharge Summary  Margaret Perry JXB:[id number] DOB: 04-19-1951 DOA: 12/16/2017  PCP: Herma CarsonMandry, Heidi, MD  Admit date: 12/16/2017 Discharge date: 12/19/2017  Admitted From: Home Disposition: Home Recommendations for Outpatient Follow-up:  1. Follow up with PCP in 1-2 weeks 2. Please obtain BMP/CBC in one week  Home Health: None Equipment/Devices none  Discharge Condition stable CODE STATUS: Full code Diet recommendation: Regular diet  Brief/Interim Summary:67 y.o.femalewith medical history significant formigraines, anxiety, depression, and insomnia, now presenting to the emergency department with right upper quadrant abdominal pain, exertional dyspnea, lethargy, and pallor. Patient reports that she had been in her usual state of health until approximately 5 days ago when she noted a burning discomfort in the right upper quadrant with mild nausea and one episode of vomiting which may have contained some specks of red blood. She then had a bowel movement described as black and tarry. Since that time, she has noted progressive exertional dyspnea and lethargy. She was seen in the emergency department 1 day after the black tarry stool, was fluid resuscitated, and discharged home. She [facility] her gastroenterologist, [facility], upper endoscopy was recommended, but the patient declined at that time. She now presents with progressive worsening in her dyspnea and lethargy, as well as persistent burning discomfort in the right upper quadrant. Denies any bowel movement in the past few days and is no longer vomiting. Denies any alcohol use but reports using Excedrin Migraine with aspirin about once a week.  ED Course:Upon arrival to the ED, patient is found to be afebrile, saturating well on room air, and with vitals otherwise normal. CBC is notable for a hemoglobin of 7.4, down from 10.6 fourdays earlier,and down from 13.1 in June. Fecal occult blood  testing is positive. INR is normal and chemistry panel is notable for mild hypokalemia. CT of the abdomen and pelvis demonstrates inflammatory changes at the second portion of the duodenum, possibly reflecting peptic ulcer disease, but without fluid collection or perforation identified patient was given a liter of normal saline and started on Protonix infusion in the ED. She has remained hemodynamically stable with no tachycardia or hypotension, and is not had any she will be admitted to the telemetry unit for ongoing evaluation and management of symptomatic anemia,likely secondary to upper GI bleed.     Discharge Diagnoses:  Principal Problem:   Symptomatic anemia Active Problems:   Depression   Anxiety   Hypokalemia   Upper GI bleed   Insomnia   Migraines 1.Symptomatic anemia; upper GIB  - Presents with DOE and lethargy in setting of RUQ pain with N/V and dark tarry stool -No hypotension or tachycardia, no BM since black tarry stool on 1/14, and no more vomiting  - Denies any alcohol use, reports use of Excedrin migraine (with ASA) about once weekly, no hx of GIB -Hgb is 7.4 on admission, down from 10.6 four days earlier, and down from 13.1 in June '18 -FOBT is positive -CT abd/pelvis with inflammatory changes involving 2nd portion of duodenum without fluid-collection or perforation  - She was started on Protonix infusion in ED -Type and screen, transfused 3 unit. EGD-mild gastritis biopsy was done.  Large nonbleeding duodenal ulcer with no stigmata of recent bleeding.  Normal second portion of the duodenum.  Will start p.o. Protonix.  Patient tolerated regular diet. Patient will follow up with GI in 2 weeks after discharge.  Her hemoglobin is 10.7 after 3 units of blood transfusion.  2.Depression, anxiety, insomnia -Continue  Lexapro, prn Xanax, and prn Ambien  3.Migraines -No HA on admission -She manages this with prn Excedrin migraine at home, but  counseled will likely need to avoid NSAID's going forwardDC Excedrin. 4.Hypokalemia -Serum potassium is 3.3 replete.  Recheck labs as an outpatient.     Discharge Instructions   Allergies as of 12/19/2017      Reactions   Reglan [metoclopramide]    Suicidal ideations      Medication List    STOP taking these medications   aspirin-acetaminophen-caffeine 250-250-65 MG tablet Commonly known as:  EXCEDRIN MIGRAINE     TAKE these medications   ALPRAZolam 0.5 MG tablet Commonly known as:  XANAX Take 0.5 mg by mouth 3 (three) times daily as needed for anxiety.   buPROPion 300 MG 24 hr tablet Commonly known as:  WELLBUTRIN XL Take 300 mg by mouth daily.   diphenoxylate-atropine 2.5-0.025 MG tablet Commonly known as:  LOMOTIL Take 1 tablet by mouth 4 (four) times daily as needed for diarrhea or loose stools.   escitalopram 20 MG tablet Commonly known as:  LEXAPRO Take 20 mg by mouth daily.   ondansetron 4 MG disintegrating tablet Commonly known as:  ZOFRAN ODT Take 1 tablet (4 mg total) by mouth every 8 (eight) hours as needed for nausea.   pantoprazole 40 MG tablet Commonly known as:  PROTONIX Take 1 tablet (40 mg total) by mouth 2 (two) times daily.   zolpidem 5 MG tablet Commonly known as:  AMBIEN Take 20 mg by mouth at bedtime.       Allergies  Allergen Reactions  . Reglan [Metoclopramide]     Suicidal ideations    Consultations: Dr. Loreta Ave   Procedures/Studies: Ct Abdomen Pelvis W Contrast  Result Date: 12/16/2017 CLINICAL DATA:  67 year old female with abdominal pain. EXAM: CT ABDOMEN AND PELVIS WITH CONTRAST TECHNIQUE: Multidetector CT imaging of the abdomen and pelvis was performed using the standard protocol following bolus administration of intravenous contrast. CONTRAST:  ISOVUE-300 IOPAMIDOL (ISOVUE-300) INJECTION 61% COMPARISON:  Lumbar spine radiograph dated 07/18/2017 FINDINGS: Lower chest: Minimal right lung base atelectasis/scarring.  No intra-free air or free fluid. Hepatobiliary: Cholecystectomy. The liver is unremarkable. No intrahepatic biliary ductal dilatation. Pancreas: Unremarkable. No pancreatic ductal dilatation or surrounding inflammatory changes. Spleen: Normal in size without focal abnormality. Adrenals/Urinary Tract: Adrenal glands are unremarkable. Kidneys are normal, without renal calculi, focal lesion, or hydronephrosis. Bladder is unremarkable. Stomach/Bowel: There is inflammatory changes of the second portion of the duodenum most consistent with duodenitis. Underlying peptic ulcer is not entirely excluded. Clinical correlation is recommended there are small scattered distal colonic diverticula without active inflammatory changes. No bowel obstruction. Normal appendix. Vascular/Lymphatic: Mild aortoiliac atherosclerotic disease. The origins of the celiac axis, SMA, IMA are patent. No portal venous gas. There is no adenopathy. Reproductive: The uterus and ovaries are grossly unremarkable. Other: Small fat containing umbilical hernia Musculoskeletal: Degenerative changes at L4-L5. No acute osseous pathology. IMPRESSION: 1. Inflammatory changes of the second portion of the duodenum may represent duodenitis. Peptic ulcer is not excluded. There is no fluid collection or perforation. 2. Sigmoid diverticulosis.  No bowel obstruction.  Normal appendix. 3. Mild Aortic Atherosclerosis (ICD10-I70.0). Electronically Signed   By: Elgie Collard M.D.   On: 12/16/2017 23:16    (Echo, Carotid, EGD, Colonoscopy, ERCP)    Subjective:   Discharge Exam: Vitals:   12/19/17 0437 12/19/17 0921  BP: (!) 108/47 (!) 117/54  Pulse: 62 65  Resp: 20 12  Temp: 98.5 F (36.9  C) 98.4 F (36.9 C)  SpO2: 97% 97%   Vitals:   12/18/17 1637 12/18/17 2021 12/19/17 0437 12/19/17 0921  BP: 126/64 (!) 114/93 (!) 108/47 (!) 117/54  Pulse: 71 72 62 65  Resp: 18 19 20 12   Temp: 98.3 F (36.8 C) 98.6 F (37 C) 98.5 F (36.9 C) 98.4 F (36.9  C)  TempSrc: Oral Oral Oral Oral  SpO2: 97% 95% 97% 97%  Weight:  84.6 kg (186 lb 8.2 oz)    Height:        General: Pt is alert, awake, not in acute distress Cardiovascular: RRR, S1/S2 +, no rubs, no gallops Respiratory: CTA bilaterally, no wheezing, no rhonchi Abdominal: Soft, NT, ND, bowel sounds + Extremities: no edema, no cyanosis    The results of significant diagnostics from this hospitalization (including imaging, microbiology, ancillary and laboratory) are listed below for reference.     Microbiology: No results found for this or any previous visit (from the past 240 hour(s)).   Labs: BNP (last 3 results) No results for input(s): BNP in the last 8760 hours. Basic Metabolic Panel: Recent Labs  Lab 12/12/17 1520 12/16/17 1951 12/17/17 0500 12/18/17 0947 12/19/17 1032  NA 136 139 141 140 142  K 4.5 3.3* 3.4* 3.3* 3.1*  CL 108 107 111 110 108  CO2 22 21* 21* 21* 20*  GLUCOSE 128* 140* 108* 100* 138*  BUN 43* 9 8 7 8   CREATININE 0.97 0.92 0.80 0.86 0.93  CALCIUM 8.4* 8.6* 7.8* 8.4* 8.5*   Liver Function Tests: Recent Labs  Lab 12/12/17 1520 12/16/17 1951  AST 15 23  ALT 11* 16  ALKPHOS 64 60  BILITOT 0.4 0.4  PROT 6.4* 5.9*  ALBUMIN 3.5 3.4*   Recent Labs  Lab 12/12/17 1520 12/16/17 1951  LIPASE 23 26   No results for input(s): AMMONIA in the last 168 hours. CBC: Recent Labs  Lab 12/12/17 1520 12/16/17 1951 12/16/17 1958 12/17/17 0500 12/17/17 2052 12/18/17 0947 12/19/17 0344 12/19/17 1124  WBC 12.4* 7.4  --  6.6  --  6.6 8.1 5.0  NEUTROABS 10.2*  --  4.8  --   --  4.2 5.0  --   HGB 10.6* 7.4*  --  7.4* 9.9* 10.1*  10.1* 9.7* 10.7*  HCT 30.9* 22.0*  --  22.0* 30.2* 30.3*  30.2* 29.4* 32.1*  MCV 92.0 92.1  --  93.6  --  91.8 92.7 92.2  PLT 257 290  --  200  --  205 228 248   Cardiac Enzymes: Recent Labs  Lab 12/12/17 1932  TROPONINI <0.03   BNP: Invalid input(s): POCBNP CBG: Recent Labs  Lab 12/18/17 0743 12/19/17 0732   GLUCAP 86 96   D-Dimer No results for input(s): DDIMER in the last 72 hours. Hgb A1c No results for input(s): HGBA1C in the last 72 hours. Lipid Profile No results for input(s): CHOL, HDL, LDLCALC, TRIG, CHOLHDL, LDLDIRECT in the last 72 hours. Thyroid function studies No results for input(s): TSH, T4TOTAL, T3FREE, THYROIDAB in the last 72 hours.  Invalid input(s): FREET3 Anemia work up No results for input(s): VITAMINB12, FOLATE, FERRITIN, TIBC, IRON, RETICCTPCT in the last 72 hours. Urinalysis    Component Value Date/Time   COLORURINE YELLOW 12/16/2017 2000   APPEARANCEUR CLEAR 12/16/2017 2000   LABSPEC 1.012 12/16/2017 2000   PHURINE 5.0 12/16/2017 2000   GLUCOSEU NEGATIVE 12/16/2017 2000   HGBUR NEGATIVE 12/16/2017 2000   BILIRUBINUR NEGATIVE 12/16/2017 2000   KETONESUR 5 (A)  12/16/2017 2000   PROTEINUR NEGATIVE 12/16/2017 2000   NITRITE NEGATIVE 12/16/2017 2000   LEUKOCYTESUR TRACE (A) 12/16/2017 2000   Sepsis Labs Invalid input(s): PROCALCITONIN,  WBC,  LACTICIDVEN Microbiology No results found for this or any previous visit (from the past 240 hour(s)).   Time coordinating discharge: Over 30 minutes  SIGNED:   Alwyn Ren, MD  [facility] 12/19/2017, 12:25 PM Pager   If 7PM-7AM, please contact night-coverage [email or web address] Password [email or web address]

## 2018-11-19 IMAGING — CT CT ABD-PELV W/ CM
2 of 5 series · 17 of 46 positions shown, 19 images · IV contrast (APPLIED)
Comparison: Lumbar spine radiograph dated 07/18/2017

CLINICAL DATA: 66-year-old female with abdominal pain.

EXAM:
CT ABDOMEN AND PELVIS WITH CONTRAST
TECHNIQUE: Multidetector CT imaging of the abdomen and pelvis was performed
using the standard protocol following bolus administration of
intravenous contrast.
CONTRAST:  100mL 294Y6E-QSS IOPAMIDOL (294Y6E-QSS) INJECTION 61%

[Series 3: abd/ pelvis 5.0 i30f 2 · axial · 0.98mm/px · z∈[+770,+1165]mm · 14 of 89 slices shown, 16 images]
[im 5/89  soft-tissue]
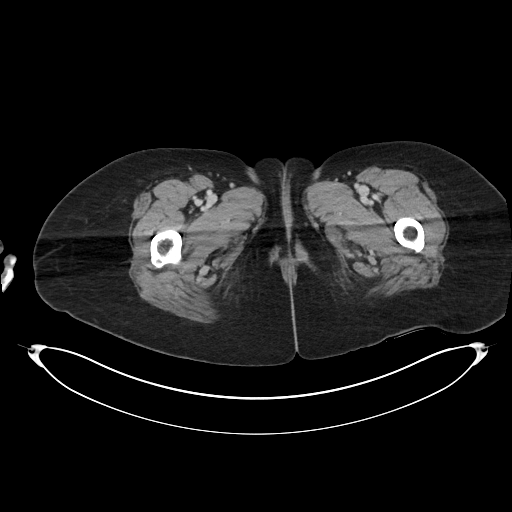
[im 5/89  bone]
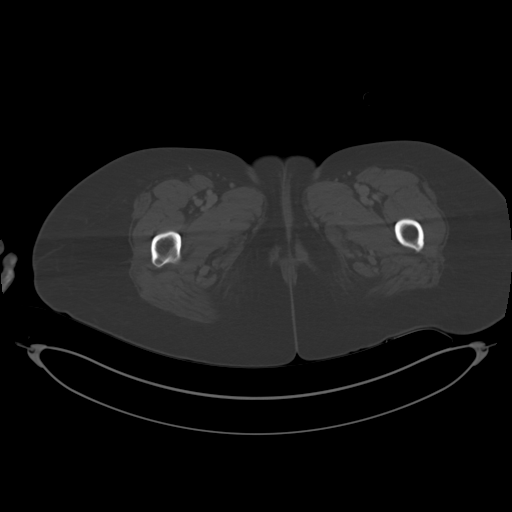
[im 14/89  soft-tissue]
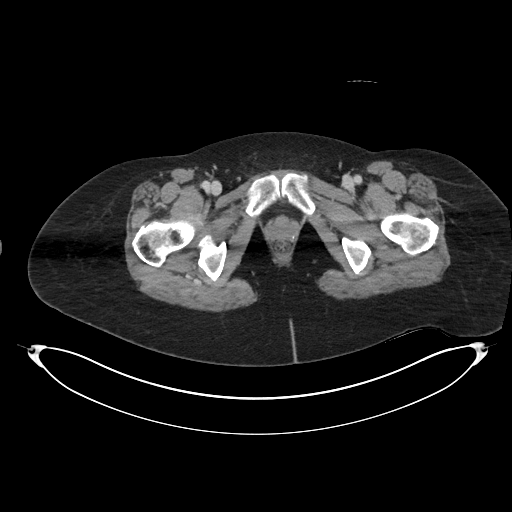
[im 18/89  soft-tissue]
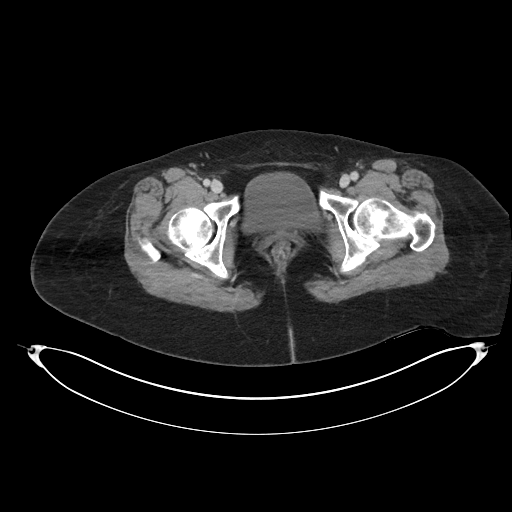
[im 23/89  soft-tissue]
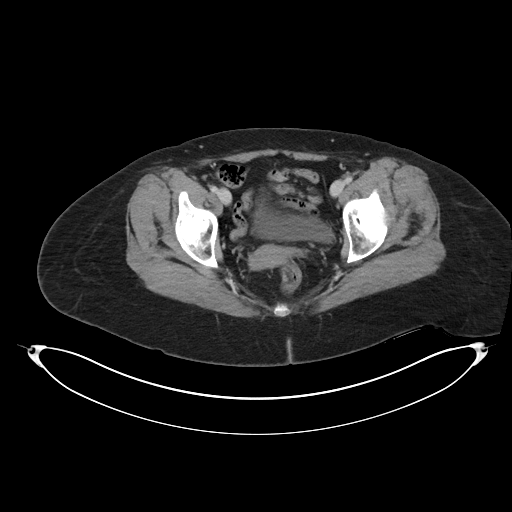
[im 31/89  soft-tissue]
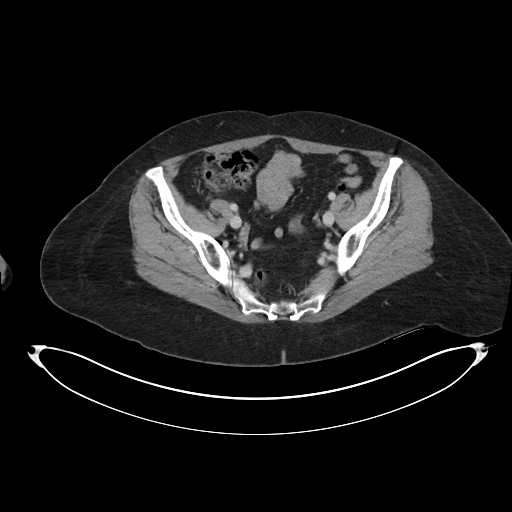
[im 36/89  soft-tissue]
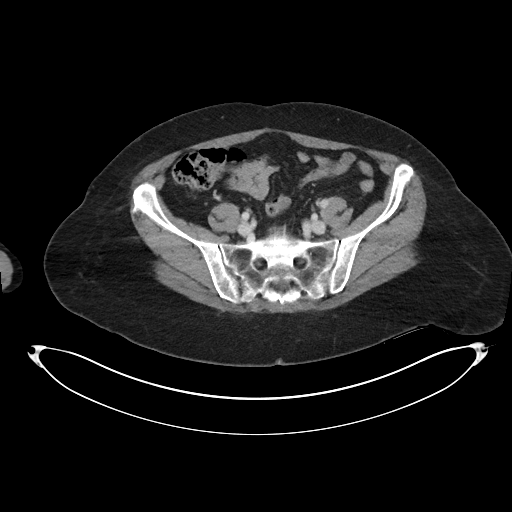
[im 40/89  soft-tissue]
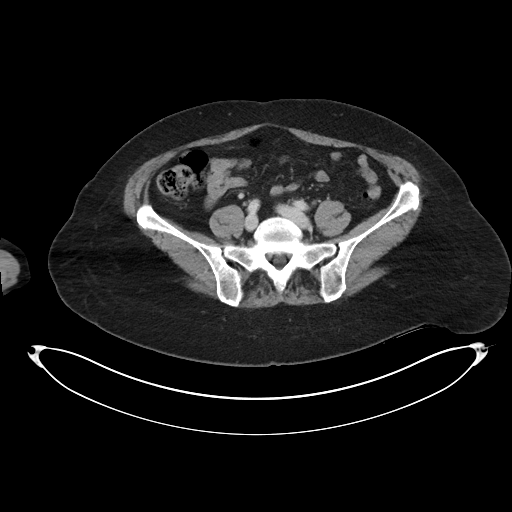
[im 49/89  soft-tissue]
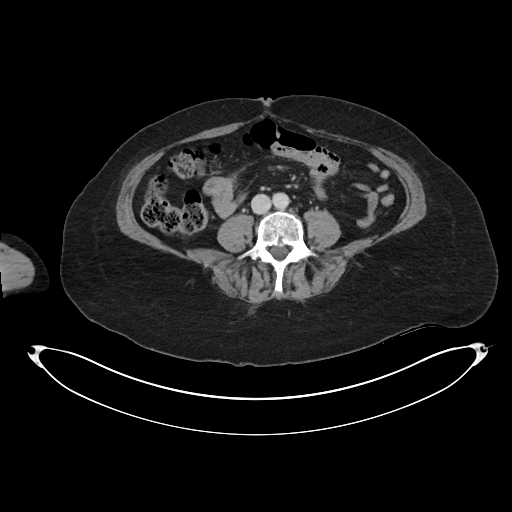
[im 53/89  soft-tissue]
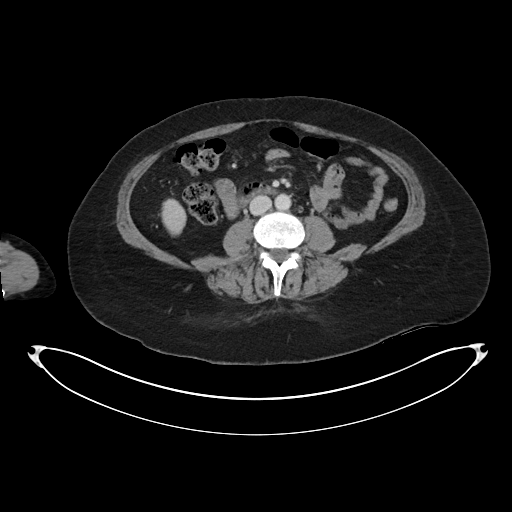
[im 53/89  bone]
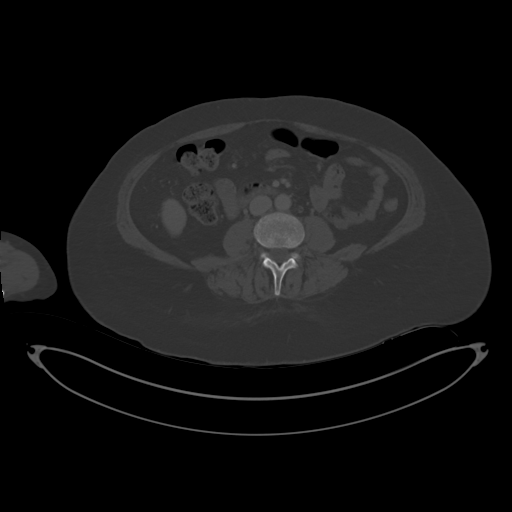
[im 58/89  soft-tissue]
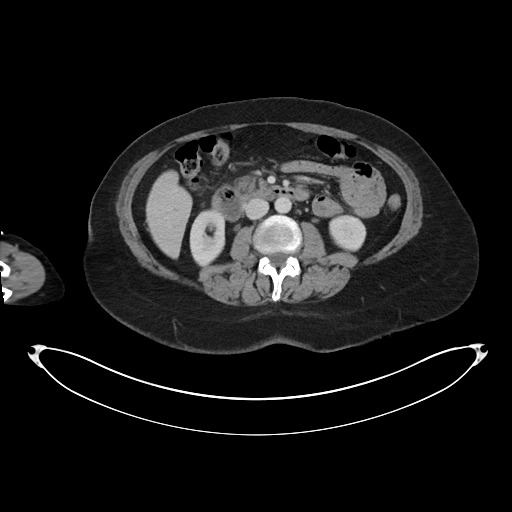
[im 67/89  soft-tissue]
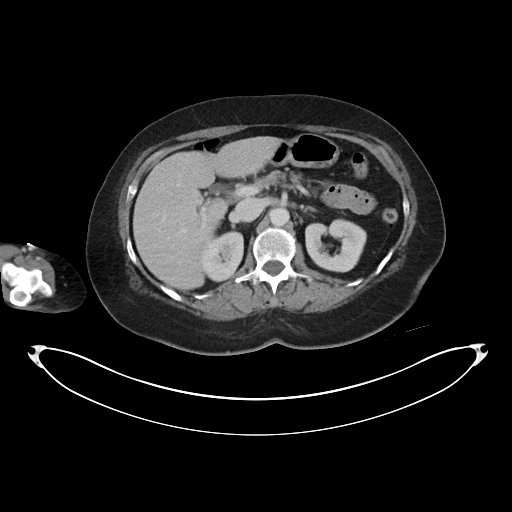
[im 71/89  soft-tissue]
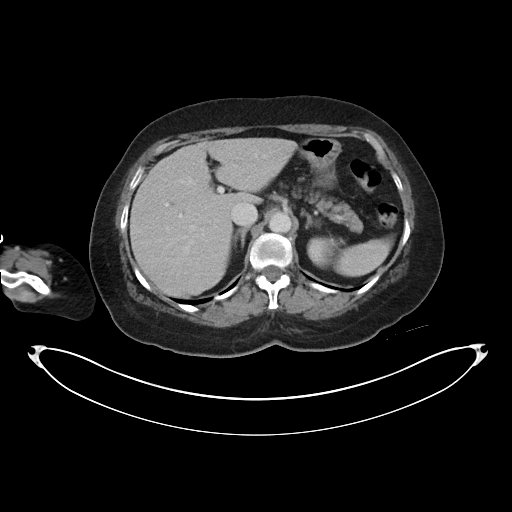
[im 75/89  soft-tissue]
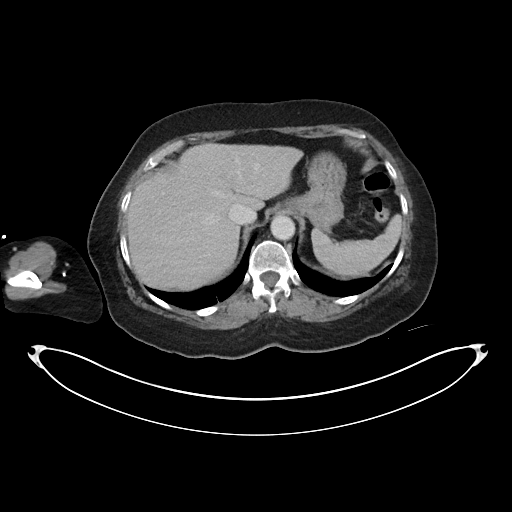
[im 84/89  soft-tissue]
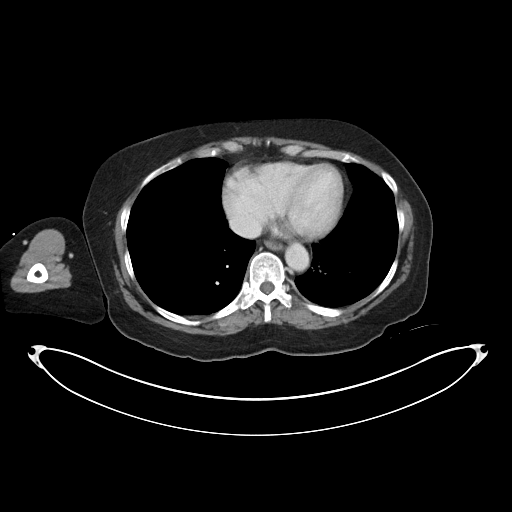

[Series 6: coronal soft tissue · coronal · 0.86mm/px · 3 of 100 slices shown]
[im 34/100  soft-tissue]
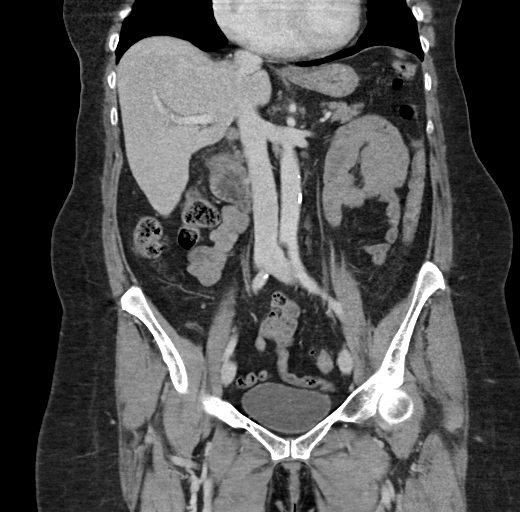
[im 45/100  soft-tissue]
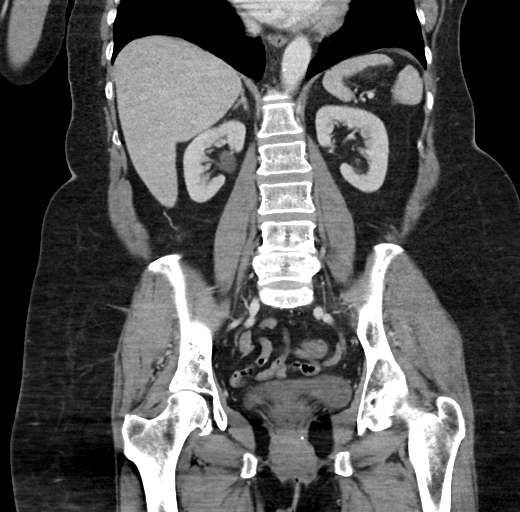
[im 56/100  soft-tissue]
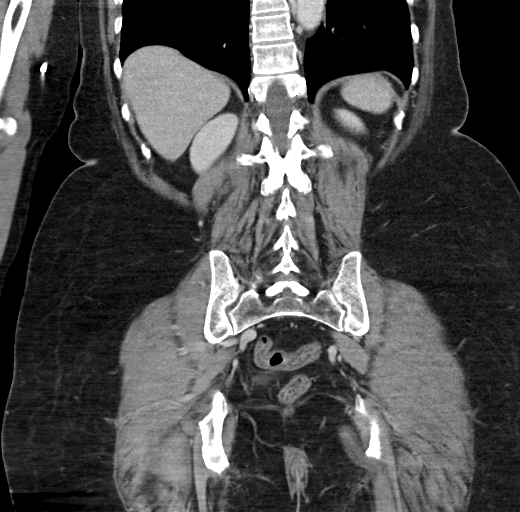

[17 of 46 positions shown; findings below may reference images not displayed]

FINDINGS: Lower chest: Minimal right lung base atelectasis/scarring.

No intra-free air or free fluid.

Hepatobiliary: Cholecystectomy. The liver is unremarkable. No
intrahepatic biliary ductal dilatation.

Pancreas: Unremarkable. No pancreatic ductal dilatation or
surrounding inflammatory changes.

Spleen: Normal in size without focal abnormality.

Adrenals/Urinary Tract: Adrenal glands are unremarkable. Kidneys are
normal, without renal calculi, focal lesion, or hydronephrosis.
Bladder is unremarkable.

Stomach/Bowel: There is inflammatory changes of the second portion
of the duodenum most consistent with duodenitis. Underlying peptic
ulcer is not entirely excluded. Clinical correlation is recommended
there are small scattered distal colonic diverticula without active
inflammatory changes. No bowel obstruction. Normal appendix.

Vascular/Lymphatic: Mild aortoiliac atherosclerotic disease. The
origins of the celiac axis, SMA, IMA are patent. No portal venous
gas. There is no adenopathy.

Reproductive: The uterus and ovaries are grossly unremarkable.

Other: Small fat containing umbilical hernia

Musculoskeletal: Degenerative changes at L4-L5. No acute osseous
pathology.
IMPRESSION: 1. Inflammatory changes of the second portion of the duodenum may
represent duodenitis. Peptic ulcer is not excluded. There is no
fluid collection or perforation.
2. Sigmoid diverticulosis.  No bowel obstruction.  Normal appendix.
3. Mild Aortic Atherosclerosis ([id number]).
